# Patient Record
Sex: Male | Born: 1950 | Race: White | Hispanic: No | Marital: Married | Smoking: Never smoker
Health system: Southern US, Community
[De-identification: ages and names within clinical notes are randomized; demographics above are authoritative.]

## PROBLEM LIST (undated history)

## (undated) DIAGNOSIS — M5030 Other cervical disc degeneration, unspecified cervical region: Secondary | ICD-10-CM

## (undated) DIAGNOSIS — K5790 Diverticulosis of intestine, part unspecified, without perforation or abscess without bleeding: Secondary | ICD-10-CM

## (undated) DIAGNOSIS — G4730 Sleep apnea, unspecified: Secondary | ICD-10-CM

## (undated) DIAGNOSIS — I712 Thoracic aortic aneurysm, without rupture: Secondary | ICD-10-CM

## (undated) DIAGNOSIS — E785 Hyperlipidemia, unspecified: Secondary | ICD-10-CM

## (undated) DIAGNOSIS — Z860100 Personal history of colon polyps, unspecified: Secondary | ICD-10-CM

## (undated) DIAGNOSIS — I7121 Aneurysm of the ascending aorta, without rupture: Secondary | ICD-10-CM

## (undated) DIAGNOSIS — I10 Essential (primary) hypertension: Secondary | ICD-10-CM

## (undated) HISTORY — DX: Personal history of colon polyps, unspecified: Z86.0100

## (undated) HISTORY — DX: Essential (primary) hypertension: I10

## (undated) HISTORY — DX: Diverticulosis of intestine, part unspecified, without perforation or abscess without bleeding: K57.90

## (undated) HISTORY — DX: Hyperlipidemia, unspecified: E78.5

## (undated) HISTORY — DX: Aneurysm of the ascending aorta, without rupture: I71.21

## (undated) HISTORY — PX: OTHER SURGICAL HISTORY: SHX169

## (undated) HISTORY — DX: Sleep apnea, unspecified: G47.30

## (undated) HISTORY — DX: Personal history of colonic polyps: Z86.010

## (undated) HISTORY — PX: COLONOSCOPY: SHX174

## (undated) HISTORY — DX: Thoracic aortic aneurysm, without rupture: I71.2

## (undated) HISTORY — DX: Other cervical disc degeneration, unspecified cervical region: M50.30

## (undated) HISTORY — PX: INGUINAL HERNIA REPAIR: SUR1180

---

## 2003-06-09 ENCOUNTER — Encounter: Admission: RE | Admit: 2003-06-09 | Discharge: 2003-06-09 | Payer: Self-pay | Admitting: Family Medicine

## 2003-11-11 ENCOUNTER — Encounter: Admission: RE | Admit: 2003-11-11 | Discharge: 2003-11-11 | Payer: Self-pay | Admitting: Family Medicine

## 2003-11-24 ENCOUNTER — Encounter: Admission: RE | Admit: 2003-11-24 | Discharge: 2003-11-24 | Payer: Self-pay | Admitting: Family Medicine

## 2004-05-16 ENCOUNTER — Encounter: Admission: RE | Admit: 2004-05-16 | Discharge: 2004-05-16 | Payer: Self-pay | Admitting: Family Medicine

## 2004-09-01 ENCOUNTER — Ambulatory Visit: Payer: Self-pay | Admitting: Cardiology

## 2004-09-30 ENCOUNTER — Ambulatory Visit: Payer: Self-pay | Admitting: Cardiology

## 2004-11-24 ENCOUNTER — Encounter: Admission: RE | Admit: 2004-11-24 | Discharge: 2004-11-24 | Payer: Self-pay | Admitting: Family Medicine

## 2006-07-26 ENCOUNTER — Encounter: Admission: RE | Admit: 2006-07-26 | Discharge: 2006-07-26 | Payer: Self-pay | Admitting: Family Medicine

## 2006-09-20 ENCOUNTER — Ambulatory Visit: Payer: Self-pay | Admitting: Internal Medicine

## 2006-10-03 ENCOUNTER — Encounter: Payer: Self-pay | Admitting: Internal Medicine

## 2006-10-03 ENCOUNTER — Ambulatory Visit: Payer: Self-pay | Admitting: Internal Medicine

## 2008-09-28 ENCOUNTER — Encounter: Admission: RE | Admit: 2008-09-28 | Discharge: 2008-09-28 | Payer: Self-pay | Admitting: Family Medicine

## 2008-10-27 ENCOUNTER — Ambulatory Visit: Payer: Self-pay | Admitting: Thoracic Surgery (Cardiothoracic Vascular Surgery)

## 2008-11-18 ENCOUNTER — Ambulatory Visit (HOSPITAL_COMMUNITY)
Admission: RE | Admit: 2008-11-18 | Discharge: 2008-11-18 | Payer: Self-pay | Admitting: Thoracic Surgery (Cardiothoracic Vascular Surgery)

## 2008-11-18 ENCOUNTER — Encounter: Payer: Self-pay | Admitting: Thoracic Surgery (Cardiothoracic Vascular Surgery)

## 2008-11-18 ENCOUNTER — Ambulatory Visit: Payer: Self-pay | Admitting: Cardiology

## 2009-10-13 ENCOUNTER — Ambulatory Visit: Payer: Self-pay | Admitting: Thoracic Surgery (Cardiothoracic Vascular Surgery)

## 2009-10-13 ENCOUNTER — Encounter
Admission: RE | Admit: 2009-10-13 | Discharge: 2009-10-13 | Payer: Self-pay | Admitting: Thoracic Surgery (Cardiothoracic Vascular Surgery)

## 2010-09-07 ENCOUNTER — Other Ambulatory Visit: Payer: Self-pay | Admitting: Thoracic Surgery (Cardiothoracic Vascular Surgery)

## 2010-09-07 DIAGNOSIS — I712 Thoracic aortic aneurysm, without rupture: Secondary | ICD-10-CM

## 2010-09-20 NOTE — Assessment & Plan Note (Signed)
OFFICE VISIT   BARNETT, ELZEY  DOB:  [birth year]                                        October 13, 2009  CHART #:  [id number]   REASON FOR VISIT:  Followup ascending aortic aneurysm.   HISTORY OF PRESENT ILLNESS:  The patient is a 60 year old gentleman with  a known ascending thoracic aneurysm.  This was first diagnosed in 2005.  He had a followup scan a year later which really had not changed and  then he missed a couple of years.  He had a followup scan in 2010 and  had increased slightly in size from about 4.2 to 4.3 cm to 4.5 cm.  He  has increased on the order of 2 mm in diameter.  He now returns for  annual followup scan.  He says he has been doing well.  He has not been  exercising particularly on a regular basis.  He did not have any chest  pain, shortness of breath, peripheral edema, orthopnea, or paroxysmal  nocturnal dyspnea.  No dizziness, presyncope, or syncope.  All other  systems are negative.   CURRENT MEDICATIONS:  1. Lovastatin 40 mg daily.  2. Levitra 20 mg daily.   ALLERGIES:  He has an allergy to Septra.   FAMILY HISTORY:  Significant for cardiovascular disease.  Both parents  have abdominal aortic aneurysms.   SOCIAL HISTORY:  He is a nonsmoker.   REVIEW OF SYSTEMS:  See HPI, otherwise all systems are negative.   PHYSICAL EXAMINATION:  General:  The patient is a 60 year old gentleman  in no acute distress.  He is alert and oriented x3 with no focal  deficits.  Vital Signs:  Blood pressure is 136/86, pulse 86,  respirations 16, ox saturation is 93% on room air.  HEENT:  Unremarkable.  He has no carotid bruits.  Lungs:  Clear with equal  breath sounds bilaterally.  Cardiac:  Regular rate and rhythm.  Normal  S1 and S2.  No rubs, murmurs, or gallops.  Abdomen:  Soft, nontender.  No pulsatile masses.  He has 2+ pulses throughout.   LABORATORY DATA:  CT angiogram was reviewed.  It shows no change in the  size of his aortic aneurysm.   Maximal diameter is about 4.4 cm.  The  cuts go down to just about the top of the kidneys.  There is no evidence  of an abdominal aneurysm either.   IMPRESSION:  The patient is a 60 year old gentleman with an ascending  aortic aneurysm.  This has really changed very little over 5- to 6-year  period of time, stable at 4.5 cm.  It large enough that needs to  continue to be followed on an annual basis.  I did recommend to him that  we try doing MR angio the next time to avoid cumulative radiation dosing  from repeated CT scans as this thing may end up being followed for 10 or  15 years and over time he could get quite a significant radiation dosing  with CT scans, so we will do MR angio on the next visit.  We will also  include the abdomen given his strong family history with both parents  having had abdominal aortic aneurysms.  He does not have any physical  signs or symptoms of that at the present time, but it would not  be a bad  idea to have a baseline through that area, but we will do it at the time  of his next chest CT.  He did have an echocardiogram after his last  visit that showed normal left ventricular function with no valvular  pathology.  Again, I will plan to see the patient back in 1 year.   Salvatore Decent Dorris Fetch, M.D.  Electronically Signed   SCH/MEDQ  D:  10/13/2009  T:  10/13/2009  Job:  [id number]   cc:   Mosetta Putt, M.D.

## 2010-09-20 NOTE — Consult Note (Signed)
NEW PATIENT CONSULTATION   Troy, Reed  DOB:  04/03/1951                                        October 27, 2008  CHART #:  [id number]   The patient is a 60 year old gentleman, who presents with chief  complaint of an aortic aneurysm.   HISTORY OF PRESENT ILLNESS:  The patient is a 60 year old gentleman with  a known history of an ascending thoracic aneurysm.  This was first  diagnosed in 2005, was detected on CT scan, it was 4.2 x 4.3 cm at that  time.  He subsequently had a followup scan in 2006.  It showed no  significant change.  In 2008 the measurements were actually slightly  smaller with a diameter recorded at 42 x 41 mm.  He recently was seen by  Dr. Duaine Dredge who scheduled him for another CT which was done on Sep 28, 2008, that was read as slight enlargement with a diameter of 4.4 x 4.5  cm in diameter.   The patient has been feeling well.  He has not had any chest pain.  He  does have some shortness of breath only with heavy exertion.  In general  feels well and is without complaint.   His past medical history is significant for hypercholesterolemia.  He  specifically denies any previous heart disease including heart attack,  valvular disease.  No history of stroke, diabetes, or hypertension.   His current medications are lovastatin 40 mg daily and Levitra 20 mg  daily p.r.n.   He has an allergy to Septra which causes a rash.   His family history is significant for cardiovascular disease,  specifically his father had abdominal aortic aneurysm.  His brother has  had heart attack.   SOCIAL HISTORY:  He is married with 3 children.  He works as a  Research scientist (medical).  He does not smoke or drink.   REVIEW OF SYSTEMS:  The patient's medical history form is reviewed and  is on the chart other than weight gain and shortness of breath with  exertion.  All systems are negative.   PHYSICAL EXAMINATION:  GENERAL:  The patient is an overweight, but  otherwise  well-appearing 60 year old white male in no acute distress.  He is well developed and well nourished.  VITAL SIGNS:  His blood pressure is 152/89, pulse 68, respirations are  18, and his oxygen saturation is 96% on room air.  NEUROLOGIC:  He is alert and oriented x3 with no focal deficits.  HEENT:  Unremarkable.  NECK:  Without thyromegaly, adenopathy, or bruits.  CARDIAC:  Regular rate and rhythm.  Normal S1 and S2.  There is a 2/6  systolic ejection murmur at the right upper sternal border.  He has 2+  pulses throughout.  There is no palpable aneurysms.  LUNGS:  Clear with equal breath sounds bilaterally.  ABDOMEN:  Soft and nontender.   LABORATORY DATA:  CT scans from Sep 28, 2008, was reviewed and compared  to scans from July 26, 2006; November 24, 2004; and November 24, 2003.  All  scans show an enlarged ascending aorta.  Measurements recorded by the  radiologist, most recent were 45 x 44 mm.  Viewing coronal sections, it  appears the largest diameter with a true cross-section, is approximately  41 mm and on sagittal appears to be approximately 41  mm as well.   IMPRESSION:  The patient is a 60 year old gentleman with a history of an  ascending aortic aneurysm.  He does not have a history of hypertension.  He was hypertensive on this visit, but I would not institute medications  based on this one reading as he has been followed closely by Dr.  Duaine Dredge with no previous history.  That should have attention noted on  his next visit.  He does have approximately 4.5 cm ascending aorta with  the true diameter quite closer to 41 mm and 45 mm.  This has increased  in size by a couple of millimeters over the past 5 years.  There has not  been a dramatic increase in size and there is no indication for surgery  at this point in time, but this does warrant continued followup.  I did  recommend to the patient at this point given that this has likely  increased, then we should re-scan him in 1 year to  make sure that there  is not some acceleration of the process.  In the meantime, I do think we  also should get an echocardiogram as ascending aortic aneurysm are  associated with bicuspid aortic valve, so he does have a faint murmur.  I doubt that he has significant aortic stenosis at this point, but if he  has a bicuspid valve  that would need to be followed as well.  As noted I will plan to see the  patient back in 1 year with a repeat CT angio.  In the meantime, we will  await the report of the echocardiogram.   Troy Reed. Dorris Fetch, M.D.  Electronically Signed   SCH/MEDQ  D:  10/27/2008  T:  10/28/2008  Job:  [id number]   cc:   Mosetta Putt, M.D.

## 2010-09-20 NOTE — Letter (Signed)
October 27, 2008   Mosetta Putt, MD  [address] [place], [place] [address]   Re:  [name], Troy Reed             DOB:  [birth year]   Dear Theron Arista,   Thank you very much for sending the patient for evaluation of his  ascending aortic aneurysm.  In reviewing his films dating back to 2005,  it does appear that this aneurysm has increased slightly in size.  It is  still only maximal diameter of 4.4-4.5 cm and there is no indication for  surgery.  I discussed the issue and reviewed the films with the patient  and we have planned to follow him up in 1 year with a repeat CT angio.  In the meantime, ascending aortic aneurysms do have some association  with bicuspid aortic valve.  We will schedule the patient for an  echocardiogram to assess his aortic valve.  If it is bicuspid that may  require separate followup with echocardiography as well.  Again, thank  you for sending the patient to the office and I will keep you informed.  In general you would be looking at a diameter of 5.5-6 cm before you  consider elective surgery for an ascending aortic aneurysm unless there  is a pattern of increasing growth usually greater than 5 mm in a year.  Thank you very much.   Sincerely,   Salvatore Decent. Dorris Fetch, M.D.  Electronically Signed   SCH/MEDQ  D:  10/27/2008  T:  10/28/2008  Job:  [id number]

## 2010-10-20 ENCOUNTER — Encounter: Payer: Self-pay | Admitting: Thoracic Surgery (Cardiothoracic Vascular Surgery)

## 2010-10-20 ENCOUNTER — Other Ambulatory Visit: Payer: Self-pay | Admitting: Thoracic Surgery (Cardiothoracic Vascular Surgery)

## 2010-10-20 DIAGNOSIS — I714 Abdominal aortic aneurysm, without rupture: Secondary | ICD-10-CM

## 2010-11-02 ENCOUNTER — Other Ambulatory Visit: Payer: Self-pay

## 2010-11-02 ENCOUNTER — Ambulatory Visit
Admission: RE | Admit: 2010-11-02 | Discharge: 2010-11-02 | Disposition: A | Discharge status: Home / Self Care | Payer: BC Managed Care – PPO | Source: Ambulatory Visit | Attending: Thoracic Surgery (Cardiothoracic Vascular Surgery) | Admitting: Thoracic Surgery (Cardiothoracic Vascular Surgery)

## 2010-11-02 DIAGNOSIS — I714 Abdominal aortic aneurysm, without rupture: Secondary | ICD-10-CM

## 2010-11-02 DIAGNOSIS — I712 Thoracic aortic aneurysm, without rupture: Secondary | ICD-10-CM

## 2010-11-02 MED ORDER — GADOBENATE DIMEGLUMINE 529 MG/ML IV SOLN
20.0000 mL | Freq: Once | INTRAVENOUS | Status: AC | PRN
  Administered 2010-11-02: 20 mL via INTRAVENOUS

## 2010-11-03 ENCOUNTER — Encounter (INDEPENDENT_AMBULATORY_CARE_PROVIDER_SITE_OTHER): Payer: BC Managed Care – PPO | Admitting: Thoracic Surgery (Cardiothoracic Vascular Surgery)

## 2010-11-03 ENCOUNTER — Encounter: Payer: Self-pay | Admitting: Thoracic Surgery (Cardiothoracic Vascular Surgery)

## 2010-11-03 DIAGNOSIS — I712 Thoracic aortic aneurysm, without rupture: Secondary | ICD-10-CM

## 2010-11-04 NOTE — Assessment & Plan Note (Signed)
OFFICE VISIT  Troy Reed, Troy Reed DOB:  February 05, 1951                                        November 03, 2010 CHART #:  [id number]  HISTORY OF PRESENT ILLNESS:  The patient is a 60 year old gentleman who has been following for the past 2 years with an ascending aortic aneurysm.  This was first diagnosed in 2005, a year later this had not changed.  He then was  lost to follow up for a few years and was seen back in 2010, the diameter has increased slightly from 4.2 was called 4.5 cm at that time.  He was seen back in 2011, at which time the scan measured approximately 4.5 cm again.  He now returns for an annual follow up with an MRI of the chest.  He states that since I last saw him a year ago, he is retired.  He has been little more active in terms of exercise.  He has lost a little weight.  He has not had any chest pain, shortness of breath, PND, orthopnea, or peripheral edema.  PAST MEDICAL HISTORY:  Significant for hypertension, ascending aortic aneurysm, and hyperlipidemia.  CURRENT MEDICATIONS: 1. Lovastatin 40 mg daily. 2. Levitra 20 mg p.r.n.  ALLERGIES:  Septra and penicillin, which is a new allergy.  FAMILY HISTORY:  Significant for abdominal aortic aneurysms.  SOCIAL HISTORY:  He is a nonsmoker, retired, remains physically active.  REVIEW OF SYSTEMS:  See HPI.  Denies dizziness, presyncope or syncope. All other systems are negative.  PHYSICAL EXAMINATION:  GENERAL:  The patient is a 60 year old gentleman in no acute distress. VITAL SIGNS:  Blood pressure 129/77, pulse 64, respirations of 18, and oxygen saturation 96% on room air. NECK:  No carotid bruits. CARDIAC:  Regular rate and rhythm.  Normal S1 and S2.  No murmurs, rubs or gallops. LUNGS:  Clear with equal breath sounds bilaterally.  There is no palpable aneurysms.  There is  2+ peripheral pulses throughout.  MR angio of the chest showed a 4.3 cm maximal diameter fusiform ascending aortic  aneurysm.  IMPRESSION:  The patient is a 60 year old gentleman who is now been followed for about 7 years in total with no evidence of increase in size of his aneurysm.  He does need to have continued annual followups for that and certainly he knows to call immediately for medical attention if he has any severe sudden onset chest pain.  His blood pressure is within normal limits, if he remains physically active.  Salvatore Decent Dorris Fetch, M.D. Electronically Signed  SCH/MEDQ  D:  11/03/2010  T:  11/04/2010  Job:  [id number]  cc:   Mosetta Putt, M.D.

## 2011-08-02 ENCOUNTER — Encounter: Payer: Self-pay | Admitting: Internal Medicine

## 2011-08-21 ENCOUNTER — Encounter: Payer: Self-pay | Admitting: Internal Medicine

## 2011-09-15 ENCOUNTER — Other Ambulatory Visit: Payer: Self-pay | Admitting: Thoracic Surgery (Cardiothoracic Vascular Surgery)

## 2011-09-15 DIAGNOSIS — I712 Thoracic aortic aneurysm, without rupture: Secondary | ICD-10-CM

## 2011-09-25 ENCOUNTER — Encounter: Payer: Self-pay | Admitting: Internal Medicine

## 2011-09-25 ENCOUNTER — Ambulatory Visit (AMBULATORY_SURGERY_CENTER): Payer: BC Managed Care – PPO | Admitting: *Deleted

## 2011-09-25 VITALS — Ht 73.5 in | Wt 251.0 lb

## 2011-09-25 DIAGNOSIS — Z1211 Encounter for screening for malignant neoplasm of colon: Secondary | ICD-10-CM

## 2011-09-25 MED ORDER — PEG-KCL-NACL-NASULF-NA ASC-C 100 G PO SOLR: ORAL | Status: DC

## 2011-10-09 ENCOUNTER — Encounter: Payer: Self-pay | Admitting: Internal Medicine

## 2011-10-09 ENCOUNTER — Ambulatory Visit (AMBULATORY_SURGERY_CENTER): Payer: BC Managed Care – PPO | Admitting: Internal Medicine

## 2011-10-09 VITALS — BP 140/88 | HR 51 | Temp 96.0°F | Resp 31 | Ht 73.0 in | Wt 251.0 lb

## 2011-10-09 DIAGNOSIS — Z1211 Encounter for screening for malignant neoplasm of colon: Secondary | ICD-10-CM

## 2011-10-09 DIAGNOSIS — Z860100 Personal history of colon polyps, unspecified: Secondary | ICD-10-CM | POA: Insufficient documentation

## 2011-10-09 DIAGNOSIS — K5730 Diverticulosis of large intestine without perforation or abscess without bleeding: Secondary | ICD-10-CM

## 2011-10-09 MED ORDER — SODIUM CHLORIDE 0.9 % IV SOLN: 500.0000 mL | INTRAVENOUS | Status: DC

## 2011-10-09 NOTE — Progress Notes (Signed)
Patient did not have preoperative order for IV antibiotic SSI prophylaxis. (G8918)  Patient did not experience any of the following events: a burn prior to discharge; a fall within the facility; wrong site/side/patient/procedure/implant event; or a hospital transfer or hospital admission upon discharge from the facility. (G8907)  

## 2011-10-09 NOTE — Patient Instructions (Signed)
YOU HAD AN ENDOSCOPIC PROCEDURE TODAY AT THE [facility]: Refer to the procedure report that was given to you for any specific questions about what was found during the examination.  If the procedure report does not answer your questions, please call your gastroenterologist to clarify.  If you requested that your care partner not be given the details of your procedure findings, then the procedure report has been included in a sealed envelope for you to review at your convenience later.  YOU SHOULD EXPECT: Some feelings of bloating in the abdomen. Passage of more gas than usual.  Walking can help get rid of the air that was put into your GI tract during the procedure and reduce the bloating. If you had a lower endoscopy (such as a colonoscopy or flexible sigmoidoscopy) you may notice spotting of blood in your stool or on the toilet paper. If you underwent a bowel prep for your procedure, then you may not have a normal bowel movement for a few days.  DIET: Your first meal following the procedure should be a light meal and then it is ok to progress to your normal diet.  A half-sandwich or bowl of soup is an example of a good first meal.  Heavy or fried foods are harder to digest and may make you feel nauseous or bloated.  Likewise meals heavy in dairy and vegetables can cause extra gas to form and this can also increase the bloating.  Drink plenty of fluids but you should avoid alcoholic beverages for 24 hours.  ACTIVITY: Your care partner should take you home directly after the procedure.  You should plan to take it easy, moving slowly for the rest of the day.  You can resume normal activity the day after the procedure however you should NOT DRIVE or use heavy machinery for 24 hours (because of the sedation medicines used during the test).    SYMPTOMS TO REPORT IMMEDIATELY: A gastroenterologist can be reached at any hour.  During normal business hours, 8:30 AM to 5:00 PM Monday through Friday,  call [phone].  After hours and on weekends, please call the GI answering service at [phone] who will take a message and have the physician on call contact you.   Following lower endoscopy (colonoscopy or flexible sigmoidoscopy):  Excessive amounts of blood in the stool  Significant tenderness or worsening of abdominal pains  Swelling of the abdomen that is new, acute  Fever of 100F or higher    FOLLOW UP: If any biopsies were taken you will be contacted by phone or by letter within the next 1-3 weeks.  Call your gastroenterologist if you have not heard about the biopsies in 3 weeks.  Our staff will call the home number listed on your records the next business day following your procedure to check on you and address any questions or concerns that you may have at that time regarding the information given to you following your procedure. This is a courtesy call and so if there is no answer at the home number and we have not heard from you through the emergency physician on call, we will assume that you have returned to your regular daily activities without incident.  SIGNATURES/CONFIDENTIALITY: You and/or your care partner have signed paperwork which will be entered into your electronic medical record.  These signatures attest to the fact that that the information above on your After Visit Summary has been reviewed and is understood.  Full responsibility of the confidentiality   of this discharge information lies with you and/or your care-partner.     

## 2011-10-09 NOTE — Progress Notes (Signed)
The pt tolerated the colonoscopy very well. Maw   

## 2011-10-09 NOTE — Op Note (Signed)
[facility] [address] [place], [place]  [address]  COLONOSCOPY PROCEDURE REPORT  PATIENT:  Troy Reed, Troy Reed  MR#:  [id number] BIRTHDATE:  Sep 06, 1950, 61 yrs. old  GENDER:  male ENDOSCOPIST:  Iva Boop, MD, [facility]  PROCEDURE DATE:  10/09/2011 PROCEDURE:  Colonoscopy 19147 ASA CLASS:  Class II INDICATIONS:  surveillance and high-risk screening, history of pre-cancerous (adenomatous) colon polyps 25 mm villous adenoma 2005 index 4 mm adenoma 2008 MEDICATIONS:   These medications were titrated to patient response per physician's verbal order, MAC sedation, administered by CRNA, propofol (Diprivan) 200 mg IV  DESCRIPTION OF PROCEDURE:   After the risks benefits and alternatives of the procedure were thoroughly explained, informed consent was obtained.  Digital rectal exam was performed and revealed no rectal masses and an enlarged prostate.  Slightly enlarged prostate, no nodules. The LB CF-[id number] endoscope was introduced through the anus and advanced to the cecum, which was identified by both the appendix and ileocecal valve, without limitations.  The quality of the prep was excellent, using MoviPrep.  The instrument was then slowly withdrawn as the colon was fully examined. <<PROCEDUREIMAGES>>  FINDINGS:  Moderate diverticulosis was found throughout the colon. This was otherwise a normal examination of the colon. Includes right colon retroflexion.   Retroflexed views in the rectum revealed internal hemorrhoids.    The time to cecum = 2:14 minutes. The scope was then withdrawn in 10:08 minutes from the cecum and the procedure completed. COMPLICATIONS:  None ENDOSCOPIC IMPRESSION: 1) Moderate diverticulosis throughout the colon 2) Internal hemorrhoids 3) Otherwise normal examination, excellent prep 4) Personal history of adenomas 2005 and 2008  REPEAT EXAM:  In 5 year(s) for routine screening colonoscopy.  Iva Boop, MD, [facility]  CC:  Mosetta Putt,  MD and The Patient  n. eSIGNED:   Iva Boop at 10/09/2011 09:39 AM  Dan Humphreys, [phone]

## 2011-10-10 ENCOUNTER — Telehealth: Payer: Self-pay | Admitting: *Deleted

## 2011-10-10 NOTE — Telephone Encounter (Signed)
  Follow up Call-  Call back number 10/09/2011  Post procedure Call Back phone  # [phone]  Permission to leave phone message Yes     Patient questions:  Do you have a fever, pain , or abdominal swelling? no Pain Score  0 *  Have you tolerated food without any problems? yes  Have you been able to return to your normal activities? yes  Do you have any questions about your discharge instructions: Diet   no Medications  no Follow up visit  no  Do you have questions or concerns about your Care? no  Actions: * If pain score is 4 or above: No action needed, pain <4.

## 2011-10-19 ENCOUNTER — Ambulatory Visit
Admission: RE | Admit: 2011-10-19 | Discharge: 2011-10-19 | Disposition: A | Discharge status: Home / Self Care | Payer: BC Managed Care – PPO | Source: Ambulatory Visit | Attending: Thoracic Surgery (Cardiothoracic Vascular Surgery) | Admitting: Thoracic Surgery (Cardiothoracic Vascular Surgery)

## 2011-10-19 DIAGNOSIS — I712 Thoracic aortic aneurysm, without rupture: Secondary | ICD-10-CM

## 2011-10-19 MED ORDER — GADOBENATE DIMEGLUMINE 529 MG/ML IV SOLN
20.0000 mL | Freq: Once | INTRAVENOUS | Status: AC | PRN
  Administered 2011-10-19: 20 mL via INTRAVENOUS

## 2011-10-23 ENCOUNTER — Other Ambulatory Visit: Payer: BC Managed Care – PPO

## 2011-10-24 ENCOUNTER — Encounter: Payer: Self-pay | Admitting: Thoracic Surgery (Cardiothoracic Vascular Surgery)

## 2011-10-24 ENCOUNTER — Ambulatory Visit (INDEPENDENT_AMBULATORY_CARE_PROVIDER_SITE_OTHER): Payer: BC Managed Care – PPO | Admitting: Thoracic Surgery (Cardiothoracic Vascular Surgery)

## 2011-10-24 VITALS — BP 126/79 | HR 60 | Resp 18 | Ht 74.0 in | Wt 248.0 lb

## 2011-10-24 DIAGNOSIS — I712 Thoracic aortic aneurysm, without rupture: Secondary | ICD-10-CM

## 2011-10-24 NOTE — Progress Notes (Signed)
  HPI:  Troy Reed returns today for a scheduled followup visit ascending aortic aneurysm.  He has no complaints. He says his been doing well over the past year. He denies any chest pain or shortness of breath. He did have some questions related to statin medications and possible side effects. He denies any myalgias. His main concern is that he heard there can be issues with memory loss.  Past Medical History  Diagnosis Date  . Hyperlipidemia   . Ascending aortic aneurysm       Current Outpatient Prescriptions  Medication Sig Dispense Refill  . lovastatin (MEVACOR) 40 MG tablet Take 40 mg by mouth at bedtime.         Physical Exam BP 126/79  Pulse 60  Resp 18  Ht 6\' 2"  (1.88 m)  Wt 248 lb (112.492 kg)  BMI 31.84 kg/m2  SpO2 96% General well-developed well-nourished 61 year old male in no acute distress Neurologic alert and oriented x3 Lungs clear with equal breath sounds bilaterally Cardiac regular rate and rhythm no rubs or murmurs Pulses intact  Diagnostic Tests:  MR angiogram chest 10/19/2011 IMPRESSION:  Unchanged fusiform ectasia of the ascending thoracic aorta  measuring approximately 4.5 cm in greatest transverse axial  dimension.  Impression: 61 year old gentleman with a 4.5 cm descending aortic aneurysm. This has been stable. There is no indication for surgery at the present time. I encouraged him to stay on his statin medication for hyper cholesterolemia unless he experiences side effects in which case he should discuss with Dr. Duaine Dredge. His blood pressure is normal.  Plan: Return in one year with MRA and of the chest for followup the ascending aortic aneurysm.

## 2012-06-26 ENCOUNTER — Other Ambulatory Visit: Payer: Self-pay | Admitting: Family Medicine

## 2012-06-26 ENCOUNTER — Ambulatory Visit
Admission: RE | Admit: 2012-06-26 | Discharge: 2012-06-26 | Disposition: A | Discharge status: Home / Self Care | Payer: BC Managed Care – PPO | Source: Ambulatory Visit | Attending: Family Medicine | Admitting: Family Medicine

## 2012-06-26 DIAGNOSIS — R52 Pain, unspecified: Secondary | ICD-10-CM

## 2012-09-27 ENCOUNTER — Other Ambulatory Visit: Payer: Self-pay | Admitting: *Deleted

## 2012-09-27 DIAGNOSIS — I712 Thoracic aortic aneurysm, without rupture: Secondary | ICD-10-CM

## 2012-10-21 ENCOUNTER — Ambulatory Visit
Admission: RE | Admit: 2012-10-21 | Discharge: 2012-10-21 | Disposition: A | Discharge status: Home / Self Care | Payer: BC Managed Care – PPO | Source: Ambulatory Visit | Attending: Thoracic Surgery (Cardiothoracic Vascular Surgery) | Admitting: Thoracic Surgery (Cardiothoracic Vascular Surgery)

## 2012-10-21 DIAGNOSIS — I712 Thoracic aortic aneurysm, without rupture: Secondary | ICD-10-CM

## 2012-10-21 MED ORDER — GADOBENATE DIMEGLUMINE 529 MG/ML IV SOLN
20.0000 mL | Freq: Once | INTRAVENOUS | Status: AC | PRN
  Administered 2012-10-21: 20 mL via INTRAVENOUS

## 2012-10-22 ENCOUNTER — Ambulatory Visit (INDEPENDENT_AMBULATORY_CARE_PROVIDER_SITE_OTHER): Payer: BC Managed Care – PPO | Admitting: Thoracic Surgery (Cardiothoracic Vascular Surgery)

## 2012-10-22 ENCOUNTER — Encounter: Payer: Self-pay | Admitting: Thoracic Surgery (Cardiothoracic Vascular Surgery)

## 2012-10-22 VITALS — BP 146/88 | HR 61 | Resp 20 | Ht 74.0 in | Wt 250.0 lb

## 2012-10-22 DIAGNOSIS — I712 Thoracic aortic aneurysm, without rupture: Secondary | ICD-10-CM

## 2012-10-22 DIAGNOSIS — E785 Hyperlipidemia, unspecified: Secondary | ICD-10-CM

## 2012-10-22 DIAGNOSIS — I10 Essential (primary) hypertension: Secondary | ICD-10-CM | POA: Insufficient documentation

## 2012-10-22 NOTE — Progress Notes (Signed)
HPI: 62 year old gentleman with a known ascending aortic aneurysm. This was first discovered back in 2005. He was last seen a year ago which time the aneurysm measured 4.5 cm in diameter. It was originally around 4.2-4.3 cm in diameter.  In the interim since his last visit he says his been feeling well. He's not had any chest pain or shortness of breath. About a week ago Dr. Duaine Dredge put him on Maxzide for his blood pressure. He had not previously had hypertension.  Past Medical History  Diagnosis Date  . Hyperlipidemia   . Ascending aortic aneurysm    hypertension   Current Outpatient Prescriptions  Medication Sig Dispense Refill  . lovastatin (MEVACOR) 40 MG tablet Take 40 mg by mouth at bedtime.       . triamterene-hydrochlorothiazide (MAXZIDE-25) 37.5-25 MG per tablet Take 1 tablet by mouth daily.        No current facility-administered medications for this visit.    Physical Exam BP 146/88  Pulse 61  Resp 20  Ht 6\' 2"  (1.88 m)  Wt 250 lb (113.399 kg)  BMI 32.08 kg/m2  SpO23 87% 62 year old male in no acute distress Well-developed well-nourished Neurologically alert and oriented x3 with no focal deficits Cardiac regular rate and rhythm, normal S1 and S2 no rubs murmurs or gallops No carotid bruits Peripheral pulses intact Lungs clear bilaterally  Diagnostic Tests: MR angiogram 10/21/2012 MRA CHEST WITH OR WITHOUT CONTRAST  Technique: Angiographic images of the chest were obtained using MRA  technique without and with intravenous contrast.  Contrast: 20mL MULTIHANCE GADOBENATE DIMEGLUMINE 529 MG/ML IV SOLN  BUN, creatinine and GFR were obtained on site at [facility]  at [address]  Results: BUN 10 mg/dL, Creatinine 1.1 mg/dL, GFR 67  Comparison: Prior MRI of the chest 10/19/2011  Findings: Conventional three-vessel arch anatomy. Stable ascending  thoracic aortic aneurysm with a maximal transverse diameter of 4.5  cm in the ascending segment  (measured at the level of the main  pulmonary artery - image 54 of series 18). The aortic root measures  3.9 cm at the sinuses of Valsalva and 3.6 cm at the sinotubular  junction. The transverse thoracic aorta measures 2.8 cm in  diameter between the origins of the left common carotid and left  subclavian artery. The descending thoracic aorta is within normal  limits at 2.7 cm in caliber.  No evidence of dissection flap, mural thrombus or irregularity.  No suspicious pulmonary nodules or abnormalities. No pleural  effusion. No enhancing soft tissue lesion identified. Scattered  unremarkable sized axillary lymph nodes. No focal osseous signal  abnormality.  IMPRESSION:  Stable fusiform ectatic/aneurysmal ascending thoracic aorta with a  maximal transverse dimension of 4.5 cm.  Signed,  Sterling Big, MD  Vascular & Interventional Radiologist  [facility]  Impression: [name] is a 62 year old gentleman with a 4.5 cm ascending aortic aneurysm. This is unchanged over the past year. It has grown minimally over the 9 year period since it was first discovered. The only change in the interim since his last visit is that he has been diagnosed with hypertension. He was started on Maxzide about a week ago. His blood pressure remains elevated today. I do not want to start any additional medications at this time. He has a followup appointment with Dr. Duaine Dredge in 3 weeks to assess his blood pressure again.   There is no indication for surgery at this time  Plan: Followup with Dr. Duaine Dredge regarding blood pressure.  Return in one year with MRA angiogram to followup the ascending aortic aneurysm.

## 2012-12-05 ENCOUNTER — Other Ambulatory Visit: Payer: Self-pay | Admitting: Family Medicine

## 2012-12-05 ENCOUNTER — Ambulatory Visit
Admission: RE | Admit: 2012-12-05 | Discharge: 2012-12-05 | Disposition: A | Discharge status: Home / Self Care | Payer: BC Managed Care – PPO | Source: Ambulatory Visit | Attending: Family Medicine | Admitting: Family Medicine

## 2012-12-05 DIAGNOSIS — T148XXA Other injury of unspecified body region, initial encounter: Secondary | ICD-10-CM

## 2012-12-05 DIAGNOSIS — T1490XA Injury, unspecified, initial encounter: Secondary | ICD-10-CM

## 2012-12-05 MED ORDER — IOHEXOL 300 MG/ML  SOLN
125.0000 mL | Freq: Once | INTRAMUSCULAR | Status: AC | PRN
  Administered 2012-12-05: 125 mL via INTRAVENOUS

## 2013-05-20 LAB — PULMONARY FUNCTION TEST

## 2013-05-30 ENCOUNTER — Other Ambulatory Visit (HOSPITAL_COMMUNITY): Payer: Self-pay | Admitting: Family Medicine

## 2013-05-30 DIAGNOSIS — R0602 Shortness of breath: Secondary | ICD-10-CM

## 2013-06-10 ENCOUNTER — Ambulatory Visit (HOSPITAL_COMMUNITY)
Admission: RE | Admit: 2013-06-10 | Discharge: 2013-06-10 | Disposition: A | Discharge status: Home / Self Care | Payer: Medicare Other | Source: Ambulatory Visit | Attending: Family Medicine | Admitting: Family Medicine

## 2013-06-10 DIAGNOSIS — R0602 Shortness of breath: Secondary | ICD-10-CM | POA: Insufficient documentation

## 2013-06-10 DIAGNOSIS — R062 Wheezing: Secondary | ICD-10-CM | POA: Insufficient documentation

## 2013-06-10 LAB — PULMONARY FUNCTION TEST
DL/VA % pred: 116 %
DL/VA: 5.49 ml/min/mmHg/L
DLCO UNC % PRED: 101 %
DLCO unc: 35.54 ml/min/mmHg
FEF 25-75 PRE: 2.55 L/s
FEF 25-75 Post: 3.31 L/sec
FEF2575-%Change-Post: 29 %
FEF2575-%Pred-Post: 113 %
FEF2575-%Pred-Pre: 87 %
FEV1-%CHANGE-POST: 8 %
FEV1-%Pred-Post: 89 %
FEV1-%Pred-Pre: 82 %
FEV1-PRE: 3.03 L
FEV1-Post: 3.29 L
FEV1FVC-%Change-Post: -5 %
FEV1FVC-%PRED-PRE: 101 %
FEV6-%Change-Post: 10 %
FEV6-%PRED-POST: 93 %
FEV6-%PRED-PRE: 84 %
FEV6-POST: 4.38 L
FEV6-PRE: 3.95 L
FEV6FVC-%CHANGE-POST: -1 %
FEV6FVC-%PRED-POST: 103 %
FEV6FVC-%PRED-PRE: 104 %
FVC-%Change-Post: 14 %
FVC-%PRED-PRE: 80 %
FVC-%Pred-Post: 91 %
FVC-POST: 4.54 L
FVC-PRE: 3.98 L
POST FEV6/FVC RATIO: 98 %
PRE FEV6/FVC RATIO: 99 %
Post FEV1/FVC ratio: 72 %
Pre FEV1/FVC ratio: 76 %
RV % PRED: 143 %
RV: 3.52 L
TLC % PRED: 103 %
TLC: 7.67 L

## 2013-06-10 MED ORDER — ALBUTEROL SULFATE (2.5 MG/3ML) 0.083% IN NEBU
2.5000 mg | INHALATION_SOLUTION | Freq: Once | RESPIRATORY_TRACT | Status: AC
  Administered 2013-06-10: 2.5 mg via RESPIRATORY_TRACT

## 2013-09-17 ENCOUNTER — Other Ambulatory Visit: Payer: Self-pay

## 2013-09-17 DIAGNOSIS — I712 Thoracic aortic aneurysm, without rupture, unspecified: Secondary | ICD-10-CM

## 2013-09-23 ENCOUNTER — Other Ambulatory Visit: Payer: Self-pay

## 2013-09-23 DIAGNOSIS — I712 Thoracic aortic aneurysm, without rupture, unspecified: Secondary | ICD-10-CM

## 2013-10-14 ENCOUNTER — Ambulatory Visit
Admission: RE | Admit: 2013-10-14 | Discharge: 2013-10-14 | Disposition: A | Discharge status: Home / Self Care | Payer: Medicare Other | Source: Ambulatory Visit | Attending: Thoracic Surgery (Cardiothoracic Vascular Surgery) | Admitting: Thoracic Surgery (Cardiothoracic Vascular Surgery)

## 2013-10-14 ENCOUNTER — Encounter: Payer: Self-pay | Admitting: Thoracic Surgery (Cardiothoracic Vascular Surgery)

## 2013-10-14 ENCOUNTER — Ambulatory Visit (INDEPENDENT_AMBULATORY_CARE_PROVIDER_SITE_OTHER): Payer: Medicare Other | Admitting: Thoracic Surgery (Cardiothoracic Vascular Surgery)

## 2013-10-14 VITALS — BP 119/72 | HR 69 | Resp 20 | Ht 74.0 in | Wt 250.0 lb

## 2013-10-14 DIAGNOSIS — I712 Thoracic aortic aneurysm, without rupture, unspecified: Secondary | ICD-10-CM

## 2013-10-14 DIAGNOSIS — I7121 Aneurysm of the ascending aorta, without rupture: Secondary | ICD-10-CM

## 2013-10-14 MED ORDER — GADOBENATE DIMEGLUMINE 529 MG/ML IV SOLN
20.0000 mL | Freq: Once | INTRAVENOUS | Status: AC | PRN
Start: 2013-10-14 — End: 2013-10-14
  Administered 2013-10-14: 20 mL via INTRAVENOUS

## 2013-10-14 NOTE — Progress Notes (Signed)
  HPI:  63 year old gentleman with a known ascending aortic aneurysm. This was first discovered back in 2005. He was last seen in the office in June of 2014 at which time the aneurysm measured 4.5 cm in diameter.  It was originally around 4.2-4.3 cm in diameter. Dr. Sandi Mariscal diagnosed him with hypertension for the first time around that time. He has been on medication for that since then.  In the interim since his last visit he says his been feeling well. He denies chest pain, shortness of breath, and swelling in his legs. He remains active.  He is a lifelong nonsmoker.  Past Medical History  Diagnosis Date  . Hyperlipidemia   . Ascending aortic aneurysm    hypertension   Current Outpatient Prescriptions  Medication Sig Dispense Refill  . chlorthalidone (HYGROTON) 25 MG tablet Take 25 mg by mouth daily.       Marland Kitchen losartan (COZAAR) 50 MG tablet Take 50 mg by mouth daily.       Marland Kitchen lovastatin (MEVACOR) 40 MG tablet Take 40 mg by mouth at bedtime.        No current facility-administered medications for this visit.    Physical Exam BP 119/72  Pulse 69  Resp 20  Ht 6\' 2"  (1.88 m)  Wt 250 lb (113.399 kg)  BMI 32.08 kg/m2  SpO19 54% 63 year old man in no acute distress Well-developed and well-nourished Alert and oriented x3 with no focal deficits Cardiac regular rate and rhythm normal S1 and S2 Lungs clear  Diagnostic Tests: MR angiogram has not been officially read at the time of this dictation  Comparison to his MR from June of 2014 shows no significant change  Impression: 63 year old man with a 4.5 cm ascending aortic aneurysm. This has increased minimally in size in the 10 years since it was first discovered. It has not changed significantly over the past year  Plan:  Return in one year with MRA angiogram

## 2014-04-24 ENCOUNTER — Ambulatory Visit
Admission: RE | Admit: 2014-04-24 | Discharge: 2014-04-24 | Disposition: A | Discharge status: Home / Self Care | Payer: Medicare Other | Source: Ambulatory Visit | Attending: Family Medicine | Admitting: Family Medicine

## 2014-04-24 ENCOUNTER — Other Ambulatory Visit: Payer: Self-pay | Admitting: Family Medicine

## 2014-04-24 DIAGNOSIS — M79605 Pain in left leg: Secondary | ICD-10-CM

## 2014-04-24 DIAGNOSIS — M25562 Pain in left knee: Secondary | ICD-10-CM

## 2014-06-02 ENCOUNTER — Telehealth: Payer: Self-pay | Admitting: Cardiology

## 2014-06-02 NOTE — Telephone Encounter (Signed)
Received records from Dr Derinda Late for appointment on 06/10/14 with Dr Percival Spanish.  Records given to [facility] (medical records) for Dr Hochrein's schedule on 06/10/14. lp

## 2014-06-10 ENCOUNTER — Encounter: Payer: Self-pay | Admitting: Cardiology

## 2014-06-10 ENCOUNTER — Ambulatory Visit (INDEPENDENT_AMBULATORY_CARE_PROVIDER_SITE_OTHER): Payer: Medicare Other | Admitting: Cardiology

## 2014-06-10 VITALS — BP 132/70 | HR 67 | Ht 73.0 in | Wt 263.5 lb

## 2014-06-10 DIAGNOSIS — I429 Cardiomyopathy, unspecified: Secondary | ICD-10-CM

## 2014-06-10 DIAGNOSIS — Z8249 Family history of ischemic heart disease and other diseases of the circulatory system: Secondary | ICD-10-CM

## 2014-06-10 DIAGNOSIS — R0600 Dyspnea, unspecified: Secondary | ICD-10-CM

## 2014-06-10 NOTE — Patient Instructions (Signed)
your physician recommends that you schedule a follow-up appointment in: one year with Dr. Percival Spanish   We are ordering a stress test and ct calcium score

## 2014-06-10 NOTE — Progress Notes (Signed)
Cardiology Office Note   Date:  06/10/2014   ID:  Troy Reed, DOB 05/25/1950, MRN [id number]  PCP:  Marylene Land, MD  Cardiologist:   Minus Breeding, MD   Chief Complaint  Patient presents with  . Shortness of Breath      History of Present Illness: Troy Reed is a 64 y.o. male who presents for evaluation of dyspnea. I saw him about a decade ago and he had a negative stress test. He has been having some dyspnea. He thinks this is relatively mild. He can climb a few flights of stairs and will get more short of breath and use to but he thinks it is in keeping with his age. He says he recovers fairly quickly. He denies any PND or orthopnea. He denies any palpitations, presyncope or syncope. He does have mild aortic dilatation that is followed with MRI. He does not notice any weight gain or edema. 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  Past Medical History  Diagnosis Date  . Hyperlipidemia   . Ascending aortic aneurysm     4.4 cm  . Hypertension   . DDD (degenerative disc disease), cervical   . History of colonic polyps   . Diverticulosis     Past Surgical History  Procedure Laterality Date  . Inguinal hernia repair Left   . Benign tumor removed      knee     Current Outpatient Prescriptions  Medication Sig Dispense Refill  . chlorthalidone (HYGROTON) 25 MG tablet Take 25 mg by mouth daily.     Marland Kitchen losartan (COZAAR) 100 MG tablet Take 100 mg by mouth daily.     Marland Kitchen lovastatin (MEVACOR) 40 MG tablet Take 40 mg by mouth at bedtime.     Marland Kitchen PROAIR HFA 108 (90 BASE) MCG/ACT inhaler      No current facility-administered medications for this visit.    Allergies:   Penicillins and Septra    Social History:  The patient  reports that he has never smoked. He has never used smokeless tobacco. He reports that he drinks about 1.2 oz of alcohol per week. He reports that he does not use illicit drugs.   Family History:  The patient's  family history includes CAD (age of onset: 76) in his father; Cerebral aneurysm in his mother; Heart attack (age of onset: 22) in his brother. There is no history of Colon cancer or Stomach cancer.    ROS:  Please see the history of present illness.   Otherwise, review of systems are positive for none.   All other systems are reviewed and negative.    PHYSICAL EXAM: VS:  BP 132/70 mmHg  Pulse 67  Ht 6\' 1"  (1.854 m)  Wt 263 lb 8 oz (119.523 kg)  BMI 34.77 kg/m2 , BMI Body mass index is 34.77 kg/(m^2). GEN: Well nourished, well developed, in no acute distress HEENT: normal Neck: no JVD, carotid bruits, or masses Cardiac: RRR; no murmurs, rubs, or gallops,no edema  Respiratory:  clear to auscultation bilaterally, normal work of breathing GI: soft, nontender, nondistended, + BS MS: no deformity or atrophy Skin: warm and dry, no rash Neuro:  Strength and sensation are intact Psych: euthymic mood, full affect   EKG:  EKG is ordered today. The ekg ordered today demonstrates normal sinus rhythm, rate 67, leftward axis, poor anterior R-wave progression, intervals within normal limits, no acute ST-T wave changes.   Wt Readings from Last  3 Encounters:  06/10/14 263 lb 8 oz (119.523 kg)  10/14/13 250 lb (113.399 kg)  10/22/12 250 lb (113.399 kg)      Other studies Reviewed: Additional studies/ records that were reviewed today include: I reviewed outside records provided by Dr. Sandi Mariscal.  This included labs. This included pulmonary function testing. I also reviewed an MRI within our system from 2015. Review of the above records demonstrates: results are recorded elsewhere. Pulmonary function testing was essentially normal. LDL was 98. Triglycerides 126.   ASSESSMENT AND PLAN:  DYSPNEA:  I will assess with a POET (Plain Old Exercise Treadmill).  I will bring the patient back for a POET (Plain Old Exercise Test). This will allow me to screen for obstructive coronary disease, risk stratify  and very importantly provide a prescription for exercise.  I would also like to screen with a coronary calcium score.  OBESITY:  The patient understands the need to lose weight with diet and exercise. We have discussed specific strategies for this.  DYSLIPIDEMIA:  The goal will be determined based on the testing above and in particular the coronary calcium score. If he has a lot of calcium I would suggest a goal LDL less than 70.  ASCENDING AORTIC ANEURYSM:  This is being followed by Dr. Roxan Hockey. I did review the MRI.   Current medicines are reviewed at length with the patient today.  The patient does not have concerns regarding medicines.  The following changes have been made:  no change  Labs/ tests ordered today include: POET (Plain Old Exercise Treadmill), CA score     Disposition:   FU with me as needed  Signed, Minus Breeding, MD  06/10/2014 10:28 AM    [facility]

## 2014-06-12 ENCOUNTER — Ambulatory Visit (INDEPENDENT_AMBULATORY_CARE_PROVIDER_SITE_OTHER)
Admission: RE | Admit: 2014-06-12 | Discharge: 2014-06-12 | Disposition: A | Discharge status: Home / Self Care | Payer: Medicare Other | Source: Ambulatory Visit | Attending: Cardiology | Admitting: Cardiology

## 2014-06-12 DIAGNOSIS — Z8249 Family history of ischemic heart disease and other diseases of the circulatory system: Secondary | ICD-10-CM

## 2014-06-12 DIAGNOSIS — I429 Cardiomyopathy, unspecified: Secondary | ICD-10-CM

## 2014-06-12 DIAGNOSIS — R0600 Dyspnea, unspecified: Secondary | ICD-10-CM

## 2014-06-24 ENCOUNTER — Other Ambulatory Visit: Payer: Self-pay | Admitting: *Deleted

## 2014-06-25 ENCOUNTER — Telehealth (HOSPITAL_COMMUNITY): Payer: Self-pay

## 2014-06-25 NOTE — Telephone Encounter (Signed)
Awaiting return call

## 2014-06-26 ENCOUNTER — Telehealth (HOSPITAL_COMMUNITY): Payer: Self-pay

## 2014-06-26 NOTE — Telephone Encounter (Signed)
Encounter complete. 

## 2014-06-30 ENCOUNTER — Ambulatory Visit (HOSPITAL_COMMUNITY)
Admission: RE | Admit: 2014-06-30 | Discharge: 2014-06-30 | Disposition: A | Discharge status: Home / Self Care | Payer: Medicare Other | Source: Ambulatory Visit | Attending: Cardiology | Admitting: Cardiology

## 2014-06-30 DIAGNOSIS — R0609 Other forms of dyspnea: Secondary | ICD-10-CM | POA: Diagnosis present

## 2014-06-30 DIAGNOSIS — R0600 Dyspnea, unspecified: Secondary | ICD-10-CM

## 2014-06-30 NOTE — Procedures (Signed)
Exercise Treadmill Test    Test  Exercise Tolerance Test Ordering MD: Marijo File, MD  Interpreting MD:   Unique Test No: 1 Treadmill:  1  Indication for ETT: exertional dyspnea  Contraindication to ETT: No   Stress Modality: exercise - treadmill  Cardiac Imaging Performed: non   Protocol: standard Bruce - maximal  Max BP:  190/74  Max MPHR (bpm):  154 85% MPR (bpm):  131  MPHR obtained (bpm):  131 % MPHR obtained:  85  Reached 85% MPHR (min:sec):  4:20 Total Exercise Time (min-sec): 4:21  Workload in METS:  6.20 Borg Scale: Not recorded.    Reason ETT Terminated: exertional dyspnea    ST Segment Analysis At Rest: normal ST segments - no evidence of significant ST depression With Exercise: no evidence of significant ST depression  Other Information Arrhythmia:  No Angina during ETT:  absent (0) Quality of ETT:  diagnostic  ETT Interpretation:  normal - no evidence of ischemia by ST analysis  Comments: The patient had an poor exercise tolerance.  There was no chest pain.  There was an appropriate level of dyspnea.  There were no arrhythmias, a normal heart rate response and normal BP response.  There were no ischemic ST T wave changes and a normal heart rate recovery.   Recommendations: The patient is to have a coronary calcium score.  From this study there is no evidence of obstructive CAD.

## 2014-07-01 ENCOUNTER — Telehealth: Payer: Self-pay | Admitting: Cardiology

## 2014-07-01 NOTE — Telephone Encounter (Signed)
Pt called in stating that he had a stress test done on 2/23 and he would like to know what his results were. Please f/u with pt  Thanks

## 2014-07-03 NOTE — Telephone Encounter (Signed)
Pt. Informed about his stress test again

## 2014-10-08 ENCOUNTER — Other Ambulatory Visit: Payer: Self-pay | Admitting: *Deleted

## 2014-10-08 DIAGNOSIS — I712 Thoracic aortic aneurysm, without rupture: Secondary | ICD-10-CM

## 2014-10-08 DIAGNOSIS — I7121 Aneurysm of the ascending aorta, without rupture: Secondary | ICD-10-CM

## 2014-10-09 ENCOUNTER — Other Ambulatory Visit: Payer: Self-pay | Admitting: *Deleted

## 2014-10-09 DIAGNOSIS — I712 Thoracic aortic aneurysm, without rupture: Secondary | ICD-10-CM

## 2014-10-09 DIAGNOSIS — I7121 Aneurysm of the ascending aorta, without rupture: Secondary | ICD-10-CM

## 2014-10-13 ENCOUNTER — Telehealth: Payer: Self-pay | Admitting: Cardiology

## 2014-10-13 NOTE — Telephone Encounter (Signed)
New message        Pt PCP would like to give pt a 81 mg Aspirin   is this ok???

## 2014-10-16 LAB — CREATININE, SERUM: Creat: 1.09 mg/dL (ref 0.50–1.35)

## 2014-10-16 LAB — BUN: BUN: 23 mg/dL (ref 6–23)

## 2014-10-20 ENCOUNTER — Ambulatory Visit (HOSPITAL_COMMUNITY)
Admission: RE | Admit: 2014-10-20 | Discharge: 2014-10-20 | Disposition: A | Discharge status: Home / Self Care | Payer: Medicare Other | Source: Ambulatory Visit | Attending: Thoracic Surgery (Cardiothoracic Vascular Surgery) | Admitting: Thoracic Surgery (Cardiothoracic Vascular Surgery)

## 2014-10-20 DIAGNOSIS — I712 Thoracic aortic aneurysm, without rupture: Secondary | ICD-10-CM

## 2014-10-20 DIAGNOSIS — I7121 Aneurysm of the ascending aorta, without rupture: Secondary | ICD-10-CM

## 2014-10-27 ENCOUNTER — Ambulatory Visit: Payer: Medicare Other | Admitting: Thoracic Surgery (Cardiothoracic Vascular Surgery)

## 2014-11-10 ENCOUNTER — Ambulatory Visit (INDEPENDENT_AMBULATORY_CARE_PROVIDER_SITE_OTHER): Payer: Medicare Other | Admitting: Thoracic Surgery (Cardiothoracic Vascular Surgery)

## 2014-11-10 ENCOUNTER — Encounter: Payer: Self-pay | Admitting: Thoracic Surgery (Cardiothoracic Vascular Surgery)

## 2014-11-10 ENCOUNTER — Ambulatory Visit
Admission: RE | Admit: 2014-11-10 | Discharge: 2014-11-10 | Disposition: A | Discharge status: Home / Self Care | Payer: Medicare Other | Source: Ambulatory Visit | Attending: Thoracic Surgery (Cardiothoracic Vascular Surgery) | Admitting: Thoracic Surgery (Cardiothoracic Vascular Surgery)

## 2014-11-10 VITALS — BP 117/74 | HR 84 | Resp 20 | Ht 73.0 in | Wt 250.0 lb

## 2014-11-10 DIAGNOSIS — I712 Thoracic aortic aneurysm, without rupture: Secondary | ICD-10-CM | POA: Diagnosis not present

## 2014-11-10 DIAGNOSIS — I7121 Aneurysm of the ascending aorta, without rupture: Secondary | ICD-10-CM

## 2014-11-10 MED ORDER — GADOBENATE DIMEGLUMINE 529 MG/ML IV SOLN
20.0000 mL | Freq: Once | INTRAVENOUS | Status: AC | PRN
  Administered 2014-11-10: 20 mL via INTRAVENOUS

## 2014-11-10 NOTE — Progress Notes (Signed)
      [address]       [place] [address]             [phone]       HPI:  Mr. Swim returns for annual follow-up visit.  He is a 64 year old gentleman with a known ascending aortic aneurysm. This was first discovered back in 2005.  It was originally around 4.2-4.3 cm in diameter. Dr. Sandi Mariscal diagnosed him with hypertension for the first time around that time. He has been on medication for that since then. The aneurysm measured 4.5 cm in diameter last year.  He has been feeling well over the past year. He denies chest pain, shortness of breath, and swelling in his legs. He remains active.  He is a lifelong nonsmoker.  Past Medical History  Diagnosis Date  . Hyperlipidemia   . Ascending aortic aneurysm     4.4 cm  . Hypertension   . DDD (degenerative disc disease), cervical   . History of colonic polyps   . Diverticulosis     Current Outpatient Prescriptions  Medication Sig Dispense Refill  . chlorthalidone (HYGROTON) 25 MG tablet Take 25 mg by mouth daily.     Marland Kitchen losartan (COZAAR) 100 MG tablet Take 100 mg by mouth daily.     Marland Kitchen lovastatin (MEVACOR) 40 MG tablet Take 40 mg by mouth at bedtime.     Marland Kitchen PROAIR HFA 108 (90 BASE) MCG/ACT inhaler      No current facility-administered medications for this visit.    Physical Exam BP 117/74 mmHg  Pulse 84  Resp 20  Ht 6\' 1"  (1.854 m)  Wt 250 lb (113.399 kg)  BMI 32.99 kg/m2  SpO1 34% 64 year old man in no acute distress Alert and oriented 3 with no focal deficits Well-developed and well-nourished Cardiac regular rate and rhythm with a 2/6 systolic murmur Lungs clear with equal muscle bilaterally No carotid bruits Pulses 2+ symmetrical bilaterally  Diagnostic Tests: MRA CHEST WITH OR WITHOUT CONTRAST  TECHNIQUE: Angiographic images of the chest were obtained using MRA technique without and with intravenous contrast.  CONTRAST: 31mL MULTIHANCE GADOBENATE DIMEGLUMINE 529 MG/ML IV  SOLN  COMPARISON: 10/14/2013 and chest CT 12/31/2014  FINDINGS: The aortic root at the sinuses of Valsalva measures roughly 4.0 cm and minimally changed from 2015. The mid ascending thoracic aorta measures up to 4.5 cm and unchanged. No evidence for an aortic dissection. Aortic arch measures up to 3.3 cm and stable. The mid descending thoracic aorta measures 3.0 cm and unchanged. Proximal pulmonary arteries are patent. Normal arch configuration and the great vessels are patent. Incidental imaging of the abdomen demonstrates that the celiac trunk and proximal SMA are patent.  There is no significant enlargement of the heart. No significant pericardial or pleural fluid.  IMPRESSION: Stable aneurysmal dilatation of the ascending thoracic aorta, measuring up to 4.5 cm.   Electronically Signed  By: Markus Daft M.D.  On: 11/10/2014 13:13        Impression: Mr. Mcphillips is a 64 year old man with a known 4.5 cm ascending aortic aneurysm. I personally reviewed the MR angiogram This is unchanged from his last scan a year ago.  His blood pressure is well controlled on his current regimen   Plan: Return in one year with MRA angiogram  Melrose Nakayama, MD [facility] [phone]

## 2015-04-15 ENCOUNTER — Encounter: Payer: Self-pay | Admitting: Internal Medicine

## 2015-06-02 ENCOUNTER — Encounter: Payer: Self-pay | Admitting: *Deleted

## 2015-06-14 ENCOUNTER — Encounter: Payer: Self-pay | Admitting: Cardiology

## 2015-06-14 ENCOUNTER — Ambulatory Visit (INDEPENDENT_AMBULATORY_CARE_PROVIDER_SITE_OTHER): Payer: Medicare Other | Admitting: Cardiology

## 2015-06-14 VITALS — BP 122/70 | HR 64 | Ht 74.0 in | Wt 255.5 lb

## 2015-06-14 DIAGNOSIS — I2510 Atherosclerotic heart disease of native coronary artery without angina pectoris: Secondary | ICD-10-CM | POA: Diagnosis not present

## 2015-06-14 DIAGNOSIS — R931 Abnormal findings on diagnostic imaging of heart and coronary circulation: Secondary | ICD-10-CM | POA: Insufficient documentation

## 2015-06-14 NOTE — Patient Instructions (Signed)
Medication Instructions:  Your physician recommends that you continue on your current medications as directed. Please refer to the Current Medication list given to you today.   Labwork: none  Testing/Procedures: none  Follow-Up: Follow up with Dr. Hochrein as needed.   Any Other Special Instructions Will Be Listed Below (If Applicable).     If you need a refill on your cardiac medications before your next appointment, please call your pharmacy.   

## 2015-06-14 NOTE — Progress Notes (Signed)
Cardiology Office Note   Date:  06/14/2015   ID:  Troy Reed, DOB 09-13-1950, MRN [id number]  PCP:  Marylene Land, MD  Cardiologist:   Minus Breeding, MD   No chief complaint on file.     History of Present Illness: Troy Reed is a 65 y.o. male who presents for evaluation of dyspnea. I saw him last year and he had a stress test which was negative for ischemia.  He does have some coronary calcium. He was treated for bronchitis and is now on inhalers. He thinks he does well. 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 He doesn't have any PND or orthopnea. He doesn't have any palpitations, presyncope or syncope. He has no chest pressure, neck or arm discomfort.  Past Medical History  Diagnosis Date  . Hyperlipidemia   . Ascending aortic aneurysm (HCC)     4.4 cm  . Hypertension   . DDD (degenerative disc disease), cervical   . History of colonic polyps   . Diverticulosis     Past Surgical History  Procedure Laterality Date  . Inguinal hernia repair Left   . Benign tumor removed      knee     Current Outpatient Prescriptions  Medication Sig Dispense Refill  . chlorthalidone (HYGROTON) 25 MG tablet Take 25 mg by mouth daily.     Marland Kitchen losartan (COZAAR) 100 MG tablet Take 100 mg by mouth daily.     Marland Kitchen lovastatin (MEVACOR) 40 MG tablet Take 40 mg by mouth at bedtime.     Marland Kitchen PROAIR HFA 108 (90 BASE) MCG/ACT inhaler      No current facility-administered medications for this visit.    Allergies:   Penicillins and Septra     ROS:  Please see the history of present illness.   Otherwise, review of systems are positive for none.   All other systems are reviewed and negative.    PHYSICAL EXAM: VS:  BP 122/70 mmHg  Pulse 64  Ht 6\' 2"  (1.88 m)  Wt 255 lb 8 oz (115.894 kg)  BMI 32.79 kg/m2 , BMI Body mass index is 32.79 kg/(m^2). GENERAL:  Well appearing NECK:  No jugular venous distention, waveform within normal limits, carotid upstroke  brisk and symmetric, no bruits, no thyromegaly LUNGS:  Clear to auscultation bilaterally BACK:  No CVA tenderness CHEST:  Unremarkable HEART:  PMI not displaced or sustained,S1 and S2 within normal limits, no S3, no S4, no clicks, no rubs, no murmurs ABD:  Flat, positive bowel sounds normal in frequency in pitch, no bruits, no rebound, no guarding, no midline pulsatile mass, no hepatomegaly, no splenomegaly EXT:  2 plus pulses throughout, no edema, no cyanosis no clubbing    EKG:  EKG is ordered today. The ekg ordered today demonstrates normal sinus rhythm, rate 64, leftward axis, poor anterior R-wave progression, intervals within normal limits, no acute ST-T wave changes.   Wt Readings from Last 3 Encounters:  06/14/15 255 lb 8 oz (115.894 kg)  11/10/14 250 lb (113.399 kg)  06/10/14 263 lb 8 oz (119.523 kg)      Other studies Reviewed: Additional studies/ records that were reviewed today include: I reviewed outside records provided by Dr. Sandi Mariscal.  This included labs. This included pulmonary function testing. I also reviewed an MRI within our system from 2015. Review of the above records demonstrates: results are recorded elsewhere. Pulmonary function testing was essentially normal. LDL was 98. Triglycerides 126.  ASSESSMENT AND PLAN:  CORONARY CALCIUM:  At this point I don't think further cardiovascular testing is suggested. We talked about risk reduction. I will defer to Marylene Land, MD to have follow-up POET (Plain Old Exercise Treadmill) 2 years.   OBESITY:  He did have a little weight loss we talked about this further.  DYSLIPIDEMIA:  The goal will be determined based on the testing above and in particular the coronary calcium score. If he has a lot of calcium I would suggest a goal LDL less than 70.  ASCENDING AORTIC ANEURYSM:  This is being followed by Dr. Roxan Hockey.    Current medicines are reviewed at length with the patient today.  The patient does not have  concerns regarding medicines.  The following changes have been made:  no change  Labs/ tests ordered today include: None    Disposition:   FU with me as needed  Signed, Minus Breeding, MD  06/14/2015 9:05 AM    [place]

## 2015-11-03 ENCOUNTER — Other Ambulatory Visit: Payer: Self-pay | Admitting: Thoracic Surgery (Cardiothoracic Vascular Surgery)

## 2015-11-03 DIAGNOSIS — I7121 Aneurysm of the ascending aorta, without rupture: Secondary | ICD-10-CM

## 2015-11-03 DIAGNOSIS — I712 Thoracic aortic aneurysm, without rupture: Secondary | ICD-10-CM

## 2015-11-23 ENCOUNTER — Encounter: Payer: Self-pay | Admitting: Thoracic Surgery (Cardiothoracic Vascular Surgery)

## 2015-11-23 ENCOUNTER — Ambulatory Visit (INDEPENDENT_AMBULATORY_CARE_PROVIDER_SITE_OTHER): Payer: Medicare Other | Admitting: Thoracic Surgery (Cardiothoracic Vascular Surgery)

## 2015-11-23 ENCOUNTER — Ambulatory Visit
Admission: RE | Admit: 2015-11-23 | Discharge: 2015-11-23 | Disposition: A | Discharge status: Home / Self Care | Payer: Medicare Other | Source: Ambulatory Visit | Attending: Thoracic Surgery (Cardiothoracic Vascular Surgery) | Admitting: Thoracic Surgery (Cardiothoracic Vascular Surgery)

## 2015-11-23 VITALS — BP 150/76 | HR 72 | Resp 20 | Ht 74.0 in | Wt 235.0 lb

## 2015-11-23 DIAGNOSIS — I712 Thoracic aortic aneurysm, without rupture: Secondary | ICD-10-CM | POA: Diagnosis not present

## 2015-11-23 DIAGNOSIS — I7121 Aneurysm of the ascending aorta, without rupture: Secondary | ICD-10-CM

## 2015-11-23 DIAGNOSIS — I2510 Atherosclerotic heart disease of native coronary artery without angina pectoris: Secondary | ICD-10-CM | POA: Diagnosis not present

## 2015-11-23 DIAGNOSIS — I10 Essential (primary) hypertension: Secondary | ICD-10-CM

## 2015-11-23 MED ORDER — GADOBENATE DIMEGLUMINE 529 MG/ML IV SOLN
20.0000 mL | Freq: Once | INTRAVENOUS | Status: AC | PRN
  Administered 2015-11-23: 20 mL via INTRAVENOUS

## 2015-11-23 NOTE — Progress Notes (Signed)
[address]       [place] [address]             [phone]       HPI: [name] returns for a scheduled 1 year follow-up visit.  He is a 65 year old man with a history of hypertension, hyperlipidemia, heart murmur, and a known ascending aortic aneurysm. I last saw him in the office in July 2016. The aneurysm measured 4.5 cm at that time. It originally was 4.2-4.3 cm back in 2005.  Over the past year he has felt well. He is not having any chest pain, pressure, or tightness. He gets short of breath with heavy exertion, but that has not changed recently. He denies any significant swelling in his legs. He also denies orthopnea and paroxysmal nocturnal dyspnea.  Past Medical History  Diagnosis Date  . Hyperlipidemia   . Ascending aortic aneurysm (HCC)     4.4 cm  . Hypertension   . DDD (degenerative disc disease), cervical   . History of colonic polyps   . Diverticulosis       Current Outpatient Prescriptions  Medication Sig Dispense Refill  . chlorthalidone (HYGROTON) 25 MG tablet Take 25 mg by mouth daily.     Marland Kitchen losartan (COZAAR) 100 MG tablet Take 100 mg by mouth daily.     Marland Kitchen lovastatin (MEVACOR) 40 MG tablet Take 40 mg by mouth at bedtime.     Marland Kitchen PROAIR HFA 108 (90 BASE) MCG/ACT inhaler      No current facility-administered medications for this visit.    Physical Exam BP 150/76 mmHg  Pulse 72  Resp 20  Ht 6\' 2"  (1.88 m)  Wt 235 lb (106.595 kg)  BMI 30.16 kg/m2  SpO23 10% 65 year old man in no acute distress Alert and oriented 3 with no focal deficits No carotid bruits Cardiac regular rate and rhythm with a 2/6 systolic murmur right upper sternal border Lungs clear with equal breath sounds bilaterally 2+ pulses throughout  Diagnostic Tests: MRA CHEST WITH OR WITHOUT CONTRAST  TECHNIQUE: Angiographic images of the chest were obtained using MRA technique without and with intravenous contrast.  CONTRAST: 50mL MULTIHANCE GADOBENATE  DIMEGLUMINE 529 MG/ML IV SOLN  COMPARISON: 11/10/2014 as well as multiple additional prior studies.  FINDINGS: Stable aneurysmal disease of the ascending thoracic aorta which measures 4.5 cm in greatest diameter. No involvement of the sinuses of Valsalva. The aorta at the level of the arch and descending thoracic segment show normal caliber. Proximal great vessels are normally patent and demonstrate normal branching anatomy. No evidence of aortic dissection or intramural hemorrhage.  The heart size is normal. No other incidental findings identified in the chest.  IMPRESSION: Stable mild aneurysmal disease of the ascending thoracic aorta which measures 4.5 cm in greatest diameter.   Electronically Signed  By: Aletta Edouard M.D.  On: 11/23/2015 13:39  I personally reviewed the MR chest and concur with the findings as noted above.  Impression: [name] is a 65 year old gentleman with hypertension who has a 4.5 cm ascending aneurysm.   Ascending aneurysm- is not significantly changed over the past year. Return in one year with MR angiogram  Hypertension- his blood pressure was elevated at 150/76 today. His blood pressure is been very well controlled on his current regimen and he has not had any recent high blood pressure readings. He has a cuff for home and is going to check that on a regular basis over the next several weeks.  ABCs numbers greater than [id number] systolic are greater than 90 diastolic he will talk to Dr. Sandi Mariscal about possible medication adjustments.  Plan: Return in one year with MR angiogram of chest  Melrose Nakayama, MD [facility] [phone]

## 2016-06-01 ENCOUNTER — Ambulatory Visit
Admission: RE | Admit: 2016-06-01 | Discharge: 2016-06-01 | Disposition: A | Discharge status: Home / Self Care | Payer: Medicare Other | Source: Ambulatory Visit | Attending: Family Medicine | Admitting: Family Medicine

## 2016-06-01 ENCOUNTER — Other Ambulatory Visit: Payer: Self-pay | Admitting: Family Medicine

## 2016-06-01 DIAGNOSIS — M25512 Pain in left shoulder: Secondary | ICD-10-CM

## 2016-10-18 ENCOUNTER — Other Ambulatory Visit: Payer: Self-pay | Admitting: Thoracic Surgery (Cardiothoracic Vascular Surgery)

## 2016-10-18 DIAGNOSIS — I7121 Aneurysm of the ascending aorta, without rupture: Secondary | ICD-10-CM

## 2016-10-18 DIAGNOSIS — I712 Thoracic aortic aneurysm, without rupture: Secondary | ICD-10-CM

## 2016-11-23 ENCOUNTER — Encounter: Payer: Self-pay | Admitting: Internal Medicine

## 2016-11-28 ENCOUNTER — Ambulatory Visit (INDEPENDENT_AMBULATORY_CARE_PROVIDER_SITE_OTHER): Payer: Medicare Other | Admitting: Thoracic Surgery (Cardiothoracic Vascular Surgery)

## 2016-11-28 ENCOUNTER — Ambulatory Visit
Admission: RE | Admit: 2016-11-28 | Discharge: 2016-11-28 | Disposition: A | Discharge status: Home / Self Care | Payer: Medicare Other | Source: Ambulatory Visit | Attending: Thoracic Surgery (Cardiothoracic Vascular Surgery) | Admitting: Thoracic Surgery (Cardiothoracic Vascular Surgery)

## 2016-11-28 ENCOUNTER — Encounter: Payer: Self-pay | Admitting: Thoracic Surgery (Cardiothoracic Vascular Surgery)

## 2016-11-28 VITALS — BP 114/64 | HR 69 | Resp 20 | Ht 74.0 in | Wt 221.0 lb

## 2016-11-28 DIAGNOSIS — I712 Thoracic aortic aneurysm, without rupture: Secondary | ICD-10-CM | POA: Diagnosis not present

## 2016-11-28 DIAGNOSIS — I7121 Aneurysm of the ascending aorta, without rupture: Secondary | ICD-10-CM

## 2016-11-28 MED ORDER — GADOBENATE DIMEGLUMINE 529 MG/ML IV SOLN
20.0000 mL | Freq: Once | INTRAVENOUS | Status: AC | PRN
  Administered 2016-11-28: 20 mL via INTRAVENOUS

## 2016-11-28 NOTE — Progress Notes (Signed)
[address]       [place] [address]             [phone]     HPI: Mr. Holsworth returns for a scheduled annual follow-up visit  Troy Reed is a 66 year old male with history of hypertension, hyperlipidemia, heart murmur, and an ascending aneurysm. His ascending aneurysm was first discovered in 2005. It was 4.2-4.3 cm at that time. On his most recent visit in July 2017 the aneurysm was 4.5 cm.  He feels well. He has not had any significant medical issues over the past year. He denies any chest pain, pressure, or tightness. He denies shortness of breath except with very heavy exertion. He denies orthopnea, paroxysmal nocturnal dyspnea, and peripheral edema.  Past Medical History:  Diagnosis Date  . Ascending aortic aneurysm (HCC)    4.4 cm  . DDD (degenerative disc disease), cervical   . Diverticulosis   . History of colonic polyps   . Hyperlipidemia   . Hypertension      Past Medical History:  Diagnosis Date  . Ascending aortic aneurysm (HCC)    4.4 cm  . DDD (degenerative disc disease), cervical   . Diverticulosis   . History of colonic polyps   . Hyperlipidemia   . Hypertension     Current Outpatient Prescriptions  Medication Sig Dispense Refill  . chlorthalidone (HYGROTON) 25 MG tablet Take 25 mg by mouth daily.     Marland Kitchen losartan (COZAAR) 100 MG tablet Take 100 mg by mouth daily.     Marland Kitchen lovastatin (MEVACOR) 40 MG tablet Take 40 mg by mouth at bedtime.     Marland Kitchen PROAIR HFA 108 (90 BASE) MCG/ACT inhaler      No current facility-administered medications for this visit.     Physical Exam BP 114/64   Pulse 69   Resp 20   Ht 6\' 2"  (1.88 m)   Wt 221 lb (100.2 kg)   SpO2 96% Comment: RA  BMI 28.58 kg/m  66 year old man in no acute distress Alert and oriented 3 with no focal deficits No carotid bruits Cardiac regular rate and rhythm with a 2/6 systolic murmur Lungs clear breath sounds bilaterally 2+ pulses throughout  Diagnostic Tests: MRA CHEST  WITH OR WITHOUT CONTRAST  TECHNIQUE: Angiographic images of the chest were obtained using MRA technique with intravenous contrast.  Creatinine was obtained on site at [facility] at [address]  Results: Creatinine 1.2 mg/dL.  CONTRAST:  90mL MULTIHANCE GADOBENATE DIMEGLUMINE 529 MG/ML IV SOLN  COMPARISON:  Most recent prior MRA chest 11/23/2015  FINDINGS: VASCULAR  Aorta: Stable mild aneurysmal dilatation of the tubular portion of the ascending thoracic aorta with a maximal diameter of 4.5 cm. No evidence of dissection or new abnormality. Conventional 3 vessel arch anatomy. The aortic root remains normal in caliber. No effacement of the sino-tubular junction. The transverse and descending thoracic aorta remain normal in caliber.  Heart: The heart is normal in size.  No pericardial effusion.  Pulmonary Arteries: Normal in size. No evidence of central pulmonary embolus.  Other: None  NON-VASCULAR  No focal signal abnormality or abnormal enhancement within the nonvascular structures visualized throughout the thorax and in the upper abdomen.  IMPRESSION: VASCULAR  Stable 4.5 cm fusiform aneurysmal dilatation of the tubular portion of the ascending thoracic aorta.  NON-VASCULAR  No focal abnormality or unexpected finding.  Signed,  Criselda Peaches, MD  [facility]  [facility]  Radiology   Electronically Signed   By: Jacqulynn Cadet M.D.   On: 11/28/2016 10:31 I personally reviewed the MR images and concur with the findings noted above.  Impression: Ascending aneurysm- stable at 4.5 cm. Needs continued annual follow-up.  Hypertension- well-controlled currently  Plan: Return in one year with MRA angiogram  Melrose Nakayama, MD [facility] [phone]

## 2016-12-07 ENCOUNTER — Ambulatory Visit
Admission: RE | Admit: 2016-12-07 | Discharge: 2016-12-07 | Disposition: A | Discharge status: Home / Self Care | Payer: Medicare Other | Source: Ambulatory Visit | Attending: Family Medicine | Admitting: Family Medicine

## 2016-12-07 ENCOUNTER — Other Ambulatory Visit: Payer: Self-pay | Admitting: Family Medicine

## 2016-12-07 DIAGNOSIS — M79602 Pain in left arm: Secondary | ICD-10-CM

## 2016-12-07 DIAGNOSIS — M542 Cervicalgia: Secondary | ICD-10-CM

## 2016-12-18 ENCOUNTER — Encounter: Payer: Self-pay | Admitting: Internal Medicine

## 2017-02-27 ENCOUNTER — Ambulatory Visit (AMBULATORY_SURGERY_CENTER): Payer: Self-pay | Admitting: *Deleted

## 2017-02-27 VITALS — Ht 73.0 in | Wt 215.0 lb

## 2017-02-27 DIAGNOSIS — Z86010 Personal history of colonic polyps: Secondary | ICD-10-CM

## 2017-02-27 NOTE — Progress Notes (Signed)
No egg or soy allergy known to patient  No issues with past sedation with any surgeries  or procedures, no intubation problems  No diet pills per patient No home 02 use per patient  No blood thinners per patient  Pt denies issues with constipation  No A fib or A flutter  EMMI video sent to pt's e mail pt declined   

## 2017-03-06 ENCOUNTER — Ambulatory Visit (AMBULATORY_SURGERY_CENTER): Payer: Medicare Other | Admitting: Internal Medicine

## 2017-03-06 ENCOUNTER — Encounter: Payer: Self-pay | Admitting: Internal Medicine

## 2017-03-06 VITALS — BP 123/78 | HR 65 | Temp 98.0°F | Resp 13 | Ht 73.0 in | Wt 215.0 lb

## 2017-03-06 DIAGNOSIS — K621 Rectal polyp: Secondary | ICD-10-CM | POA: Diagnosis not present

## 2017-03-06 DIAGNOSIS — D128 Benign neoplasm of rectum: Secondary | ICD-10-CM

## 2017-03-06 DIAGNOSIS — Z86010 Personal history of colonic polyps: Secondary | ICD-10-CM | POA: Diagnosis not present

## 2017-03-06 DIAGNOSIS — D129 Benign neoplasm of anus and anal canal: Secondary | ICD-10-CM

## 2017-03-06 MED ORDER — SODIUM CHLORIDE 0.9 % IV SOLN: 500.0000 mL | INTRAVENOUS | Status: DC

## 2017-03-06 NOTE — Progress Notes (Signed)
Called to room to assist during endoscopic procedure.  Patient ID and intended procedure confirmed with present staff. Received instructions for my participation in the procedure from the performing physician.  

## 2017-03-06 NOTE — Progress Notes (Signed)
Spontaneous respirations throughout. VSS. Resting comfortably. To PACU on room air. Report to  RN. 

## 2017-03-06 NOTE — Patient Instructions (Addendum)
I found and removed one tiny rectal polyp. Also saw diverticulosis again. You have internal hemorrhoids also If you have hemorrhoid problems (swelling, itching, bleeding) I am able to treat those with an in-office procedure. If you like, please call my office at [phone] to schedule an appointment and I can evaluate you further.  I will let you know pathology results and when to have another routine colonoscopy by mail and/or My Chart.  I appreciate the opportunity to care for you. Gatha Mayer, MD, FACG  YOU HAD AN ENDOSCOPIC PROCEDURE TODAY AT [facility]:   Refer to the procedure report that was given to you for any specific questions about what was found during the examination.  If the procedure report does not answer your questions, please call your gastroenterologist to clarify.  If you requested that your care partner not be given the details of your procedure findings, then the procedure report has been included in a sealed envelope for you to review at your convenience later.  YOU SHOULD EXPECT: Some feelings of bloating in the abdomen. Passage of more gas than usual.  Walking can help get rid of the air that was put into your GI tract during the procedure and reduce the bloating. If you had a lower endoscopy (such as a colonoscopy or flexible sigmoidoscopy) you may notice spotting of blood in your stool or on the toilet paper. If you underwent a bowel prep for your procedure, you may not have a normal bowel movement for a few days.  Please Note:  You might notice some irritation and congestion in your nose or some drainage.  This is from the oxygen used during your procedure.  There is no need for concern and it should clear up in a day or so.  SYMPTOMS TO REPORT IMMEDIATELY:   Following lower endoscopy (colonoscopy or flexible sigmoidoscopy):  Excessive amounts of blood in the stool  Significant tenderness or worsening of abdominal pains  Swelling of the  abdomen that is new, acute  Fever of 100F or higher  For urgent or emergent issues, a gastroenterologist can be reached at any hour by calling [phone].   DIET:  We do recommend a small meal at first, but then you may proceed to your regular diet.  Drink plenty of fluids but you should avoid alcoholic beverages for 24 hours.  ACTIVITY:  You should plan to take it easy for the rest of today and you should NOT DRIVE or use heavy machinery until tomorrow (because of the sedation medicines used during the test).    FOLLOW UP: Our staff will call the number listed on your records the next business day following your procedure to check on you and address any questions or concerns that you may have regarding the information given to you following your procedure. If we do not reach you, we will leave a message.  However, if you are feeling well and you are not experiencing any problems, there is no need to return our call.  We will assume that you have returned to your regular daily activities without incident.  If any biopsies were taken you will be contacted by phone or by letter within the next 1-3 weeks.  Please call us at [phone] if you have not heard about the biopsies in 3 weeks.    SIGNATURES/CONFIDENTIALITY: You and/or your care partner have signed paperwork which will be entered into your electronic medical record.  These signatures attest to the  fact that that the information above on your After Visit Summary has been reviewed and is understood.  Full responsibility of the confidentiality of this discharge information lies with you and/or your care-partner.

## 2017-03-06 NOTE — Progress Notes (Signed)
Pt's states no medical or surgical changes since previsit or office visit. 

## 2017-03-06 NOTE — Op Note (Signed)
Troy Reed Procedure Date: 03/06/2017 9:55 AM MRN: [id number] Endoscopist: Gatha Mayer , MD Age: 66 Referring MD:  Date of Birth: Sep 27, 1950 Gender: Male Account #: [id number] Procedure:                Colonoscopy Indications:              Surveillance: Personal history of adenomatous                            polyps on last colonoscopy 5 years ago Medicines:                Propofol per Anesthesia, Monitored Anesthesia Care Procedure:                Pre-Anesthesia Assessment:                           - Prior to the procedure, a History and Physical                            was performed, and patient medications and                            allergies were reviewed. The patient's tolerance of                            previous anesthesia was also reviewed. The risks                            and benefits of the procedure and the sedation                            options and risks were discussed with the patient.                            All questions were answered, and informed consent                            was obtained. Prior Anticoagulants: The patient has                            taken no previous anticoagulant or antiplatelet                            agents. ASA Grade Assessment: II - A patient with                            mild systemic disease. After reviewing the risks                            and benefits, the patient was deemed in                            satisfactory condition to undergo the procedure.  After obtaining informed consent, the colonoscope                            was passed under direct vision. Throughout the                            procedure, the patient's blood pressure, pulse, and                            oxygen saturations were monitored continuously. The                            Model CF-HQ190L [id number]) scope was introduced   through the anus and advanced to the the cecum,                            identified by appendiceal orifice and ileocecal                            valve. The colonoscopy was performed without                            difficulty. The patient tolerated the procedure                            well. The quality of the bowel preparation was                            good. The ileocecal valve, appendiceal orifice, and                            rectum were photographed. The ileocecal valve,                            appendiceal orifice, and rectum were photographed. Scope In: 10:05:16 AM Scope Out: 10:24:42 AM Scope Withdrawal Time: 0 hours 14 minutes 59 seconds  Total Procedure Duration: 0 hours 19 minutes 26 seconds  Findings:                 The perianal and digital rectal examinations were                            normal.                           A diminutive polyp was found in the rectum. The                            polyp was sessile. The polyp was removed with a                            cold snare. Resection and retrieval were complete.                            Verification of patient  identification for the                            specimen was done. Estimated blood loss was minimal.                           Multiple small and large-mouthed diverticula were                            found in the entire colon.                           Internal hemorrhoids were found during retroflexion.                           The exam was otherwise without abnormality on                            direct and retroflexion views. Complications:            No immediate complications. Estimated Blood Loss:     Estimated blood loss was minimal. Impression:               - One diminutive polyp in the rectum, removed with                            a cold snare. Resected and retrieved.                           - Moderate diverticulosis in the entire examined                             colon.                           - Internal hemorrhoids.                           - The examination was otherwise normal on direct                            and retroflexion views.                           - Personal history of colonic polyps. Recommendation:           - Patient has a contact number available for                            emergencies. The signs and symptoms of potential                            delayed complications were discussed with the                            patient. Return to normal activities tomorrow.  Written discharge instructions were provided to the                            patient.                           - Resume previous diet.                           - Continue present medications.                           - Repeat colonoscopy is recommended for                            surveillance. The colonoscopy date will be                            determined after pathology results from today's                            exam become available for review. Gatha Mayer, MD 03/06/2017 10:35:06 AM This report has been signed electronically.

## 2017-03-07 ENCOUNTER — Telehealth: Payer: Self-pay | Admitting: *Deleted

## 2017-03-07 NOTE — Telephone Encounter (Signed)
  Follow up Call-  Call back number 03/06/2017  Post procedure Call Back phone  # [phone] TO TALK TO WIFE  Permission to leave phone message Yes  Some recent data might be hidden     Patient questions:  Do you have a fever, pain , or abdominal swelling? No. Pain Score  0 *  Have you tolerated food without any problems? Yes.    Have you been able to return to your normal activities? Yes.    Do you have any questions about your discharge instructions: Diet   No. Medications  No. Follow up visit  No.  Do you have questions or concerns about your Care? No.  Actions: * If pain score is 4 or above: No action needed, pain <4.

## 2017-03-09 ENCOUNTER — Encounter: Payer: Self-pay | Admitting: Internal Medicine

## 2017-03-09 NOTE — Progress Notes (Signed)
Hyperplastic polyps Recall 2023 remote large villous adenoma

## 2017-10-17 ENCOUNTER — Other Ambulatory Visit: Payer: Self-pay | Admitting: *Deleted

## 2017-10-17 DIAGNOSIS — I712 Thoracic aortic aneurysm, without rupture, unspecified: Secondary | ICD-10-CM

## 2017-12-04 ENCOUNTER — Other Ambulatory Visit: Payer: Medicare Other

## 2017-12-04 ENCOUNTER — Encounter: Payer: Medicare Other | Admitting: Thoracic Surgery (Cardiothoracic Vascular Surgery)

## 2017-12-18 ENCOUNTER — Ambulatory Visit
Admission: RE | Admit: 2017-12-18 | Discharge: 2017-12-18 | Disposition: A | Discharge status: Home / Self Care | Payer: Medicare Other | Source: Ambulatory Visit | Attending: Thoracic Surgery (Cardiothoracic Vascular Surgery) | Admitting: Thoracic Surgery (Cardiothoracic Vascular Surgery)

## 2017-12-18 DIAGNOSIS — I712 Thoracic aortic aneurysm, without rupture, unspecified: Secondary | ICD-10-CM

## 2017-12-18 MED ORDER — GADOBENATE DIMEGLUMINE 529 MG/ML IV SOLN
20.0000 mL | Freq: Once | INTRAVENOUS | Status: AC | PRN
  Administered 2017-12-18: 20 mL via INTRAVENOUS

## 2017-12-21 ENCOUNTER — Other Ambulatory Visit: Payer: Self-pay | Admitting: Family Medicine

## 2017-12-21 ENCOUNTER — Ambulatory Visit
Admission: RE | Admit: 2017-12-21 | Discharge: 2017-12-21 | Disposition: A | Discharge status: Home / Self Care | Payer: Medicare Other | Source: Ambulatory Visit | Attending: Family Medicine | Admitting: Family Medicine

## 2017-12-21 DIAGNOSIS — M25472 Effusion, left ankle: Secondary | ICD-10-CM

## 2017-12-21 DIAGNOSIS — M25572 Pain in left ankle and joints of left foot: Secondary | ICD-10-CM

## 2017-12-25 ENCOUNTER — Other Ambulatory Visit: Payer: Self-pay

## 2017-12-25 ENCOUNTER — Ambulatory Visit (INDEPENDENT_AMBULATORY_CARE_PROVIDER_SITE_OTHER): Payer: Medicare Other | Admitting: Thoracic Surgery (Cardiothoracic Vascular Surgery)

## 2017-12-25 ENCOUNTER — Encounter: Payer: Self-pay | Admitting: Thoracic Surgery (Cardiothoracic Vascular Surgery)

## 2017-12-25 VITALS — BP 133/76 | HR 67 | Resp 16 | Ht 73.0 in | Wt 220.0 lb

## 2017-12-25 DIAGNOSIS — I712 Thoracic aortic aneurysm, without rupture: Secondary | ICD-10-CM

## 2017-12-25 DIAGNOSIS — I7121 Aneurysm of the ascending aorta, without rupture: Secondary | ICD-10-CM

## 2017-12-25 NOTE — Progress Notes (Signed)
[address]       [place] [address]             [phone]    HPI: Mr. Arizola returns for a scheduled follow up visit  Raeshawn Vo is a 67 year old man with a history of heart murmur, hypertension, hyperlipidemia, and an ascending aneurysm.  He was first noted to have an aneurysm in 2005.  It was about 4.3 cm at that time.  I have been following him since then recently it has been measuring 4.5 cm.  He feels well.  He has not had any significant medical issues.  He denies any chest pain, pressure, or tightness.  He denies shortness of breath, orthopnea and peripheral edema.   Past Medical History:  Diagnosis Date  . Ascending aortic aneurysm (HCC)    4.4 cm  . DDD (degenerative disc disease), cervical   . Diverticulosis   . History of colonic polyps   . Hyperlipidemia   . Hypertension     Current Outpatient Medications  Medication Sig Dispense Refill  . Fluticasone-Salmeterol (ADVAIR DISKUS IN) Inhale into the lungs.    Marland Kitchen losartan (COZAAR) 100 MG tablet Take 100 mg by mouth.    . lovastatin (MEVACOR) 40 MG tablet Take 40 mg by mouth at bedtime.      No current facility-administered medications for this visit.     Physical Exam BP 133/76 (BP Location: Right Arm, Patient Position: Sitting, Cuff Size: Large)   Pulse 67   Resp 16   Ht 6\' 1"  (1.854 m)   Wt 220 lb (99.8 kg)   SpO2 95% Comment: RA  BMI 67.55 kg/m  87-year-old man in no acute distress Alert and oriented x3 with no focal deficits Lungs clear with equal breath sounds bilaterally Cardiac regular rate and rhythm 2/6 systolic murmur right upper sternal border 2+ pulses, no carotid bruits  Diagnostic Tests: MRA CHEST WITH OR WITHOUT CONTRAST  TECHNIQUE: Preliminary sequences through the chest were obtained without contrast. Angiographic images of the chest were obtained using MRA technique with intravenous contrast.  CONTRAST:  51mL MULTIHANCE GADOBENATE DIMEGLUMINE 529 MG/ML IV  SOLN  Creatinine was obtained on site at [facility] at [address]  Results: Creatinine 1.1 mg/dL.  Estimated GFR 68 mL/minute  COMPARISON:  11/28/2016, 11/23/2015, 11/10/2014, 10/14/2013, 10/21/2012, 10/19/2011 and 11/02/2010  FINDINGS: VASCULAR  Aorta: There is stable dilatation of the ascending thoracic aorta measuring 4.5 cm in greatest diameter. Caliber of the aortic root at the sinuses of Valsalva may be slightly greater than on prior studies and measures roughly 4.3-4.4 cm. This is a difficult region to assess accurately with MRI given aortic motion. However, when reviewing prior studies it does appear that the aortic root may be mildly more prominent in caliber over time, especially dating back to studies around 2013 and 2012. Correlation with echocardiography may be helpful to assess aortic valve function.  Other aortic dimensions are stable. The proximal arch measures 3.9 cm, the distal arch 3 cm and the descending thoracic aorta 2.5-2.9 cm. The aortic arch is again noted to be very elongated. No evidence of aortic dissection. Proximal great vessels show normal branching anatomy and are normally patent.  Heart: The heart size is normal and appears stable over time. No evidence of pericardial fluid.  Pulmonary Arteries: Central pulmonary arteries are normal in caliber.  NON-VASCULAR  No other additional nonvascular findings. No pleural fluid identified. No evidence of lymphadenopathy or focal mass  lesions. Visualized bony structures appear unremarkable.  IMPRESSION: 1. Stable dilatation of the tubular portion of the ascending thoracic aorta measuring 4.5 cm in greatest diameter. 2. It is felt that the aortic root may be slightly greater in caliber compared to prior studies measuring roughly 4.3-4.4 cm at the sinuses of Valsalva. Root measurement is somewhat difficult to accurately obtain on multiple studies due to variable  aortic pulsation artifact. However, the root does appear slightly larger compared to older studies dating back to 2012. Correlation with echocardiography may be helpful to evaluate aortic valve function.   Electronically Signed   By: Aletta Edouard M.D.   On: 12/18/2017 12:37 I personally reviewed the MR images and concur with the findings noted above  Impression: Mr. Ramdass is a 67 year old gentleman with a history of hypertension, hyperlipidemia, heart murmur, and a 4.5 cm ascending aneurysm.  This was first noted in 2005 and was about 4.2 to 4.3 cm at that time.  It has been stable at 4.5 cm over the last several years.  His MRI today shows no change in the ascending aneurysm.  There is some question about possible slight increase in size in the aortic root, but I suspect this is motion artifact.  It has been a while since we have done a CT angiogram so I think we will do that the next time around to see if that will give a better measurement in the root.  Hypertension-pressure well controlled on current regimen  Plan: Return in 6 months with CT angiogram  Melrose Nakayama, MD [facility] [phone]

## 2018-03-19 ENCOUNTER — Other Ambulatory Visit: Payer: Self-pay | Admitting: Family Medicine

## 2018-03-19 ENCOUNTER — Ambulatory Visit
Admission: RE | Admit: 2018-03-19 | Discharge: 2018-03-19 | Disposition: A | Discharge status: Home / Self Care | Payer: Medicare Other | Source: Ambulatory Visit | Attending: Family Medicine | Admitting: Family Medicine

## 2018-03-19 DIAGNOSIS — R059 Cough, unspecified: Secondary | ICD-10-CM

## 2018-03-19 DIAGNOSIS — R05 Cough: Secondary | ICD-10-CM

## 2018-05-17 ENCOUNTER — Other Ambulatory Visit: Payer: Self-pay | Admitting: *Deleted

## 2018-05-17 DIAGNOSIS — I712 Thoracic aortic aneurysm, without rupture, unspecified: Secondary | ICD-10-CM

## 2018-06-21 IMAGING — MR MR MRA CHEST W/ OR W/O CM
12 series · 16 of 16 positions shown · IV contrast (20 ml Multihance)
Comparison: Most recent prior MRA chest 11/23/2015

CLINICAL DATA: 66-year-old male with a history of thoracic aortic
aneurysm

EXAM:
MRA CHEST WITH OR WITHOUT CONTRAST
TECHNIQUE: Angiographic images of the chest were obtained using MRA technique
with intravenous contrast.
Creatinine was obtained on site at [HOSPITAL] at [HOSPITAL].
Results: Creatinine 1.2 mg/dL.
CONTRAST:  20mL MULTIHANCE GADOBENATE DIMEGLUMINE 529 MG/ML IV SOLN

[Series 2: bSSFP · axial · 6.0mm · 1.48mm/px · 1 of 31 slices shown (1 of 2)]
[im 1/31]
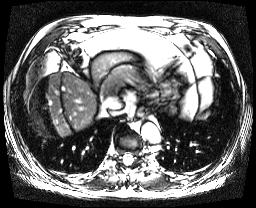

[Series 3: T2 · axial · 5.0mm · 1.48mm/px · 1 of 45 slices shown]
[im 1/45]
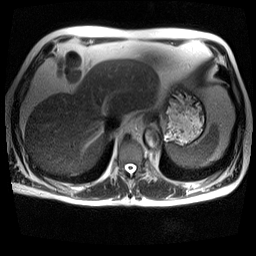

[Series 4: T1 · axial · 5.0mm · 1.48mm/px · 1 of 42 slices shown]
[im 1/42]
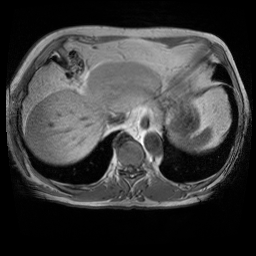

[Series 5: T1 dynamic · axial · non-contrast · 2.5mm · 0.74mm/px · 1 of 88 slices shown]
[im 1/88]
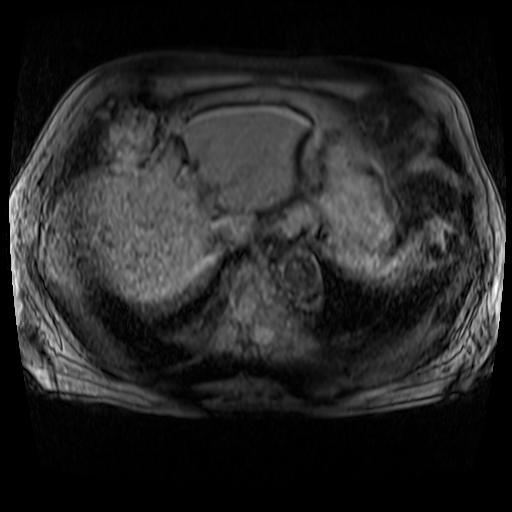

[Series 6: bSSFP · sagittal · 4.0mm · 1.64mm/px · 1 of 20 slices shown (2 of 2)]
[im 1/20]
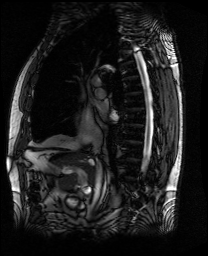

[Series 7: fl3d_cor_pre_tt=1.0s · sagittal · 1.5mm · 1.15mm/px · 1 of 104 slices shown]
[im 1/104]
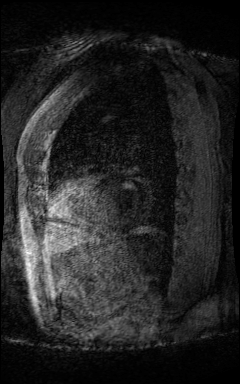

[Series 9: fl3d_cor_post_tt=1.0s · sagittal · 1.5mm · 1.15mm/px · 2 of 104 slices shown]
[im 1/104]
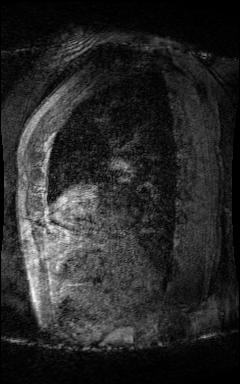
[im 104/104]
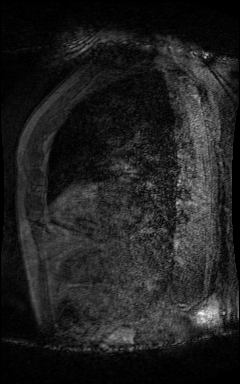

[Series 10: fl3d_cor_post_tt=1.0s_sub · sagittal · 1.5mm · 1.15mm/px · 2 of 102 slices shown]
[im 1/102]
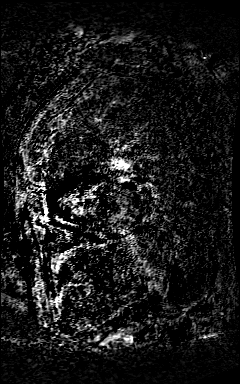
[im 102/102]
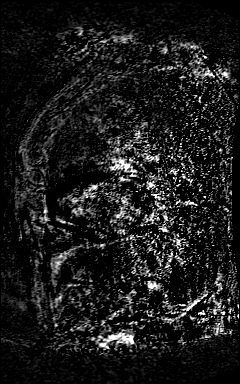

[Series 13: T1 fat-sat post-contrast · axial · 5.0mm · 1.48mm/px · 1 of 38 slices shown]
[im 1/38]
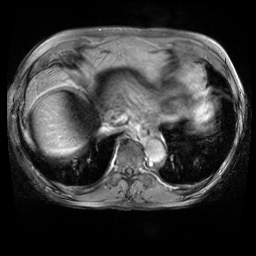

[Series 14: post flash candy · sagittal · 4.0mm · 1.48mm/px · 1 of 26 slices shown]
[im 1/26]
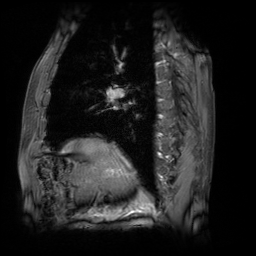

[Series 15: T1 dynamic post-contrast · axial · 2.5mm · 0.74mm/px · z∈[-64,+153]mm · 2 of 88 slices shown]
[im 1/88]
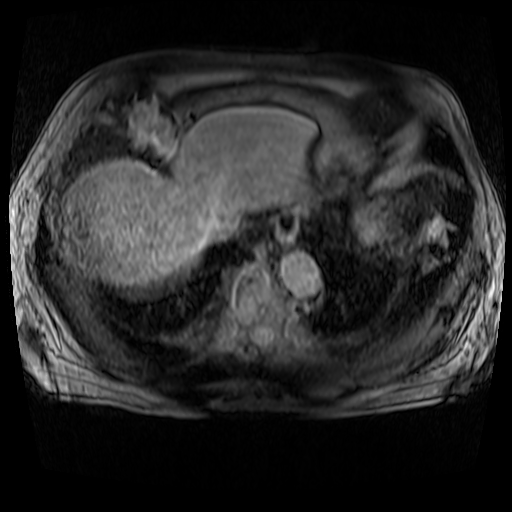
[im 88/88]
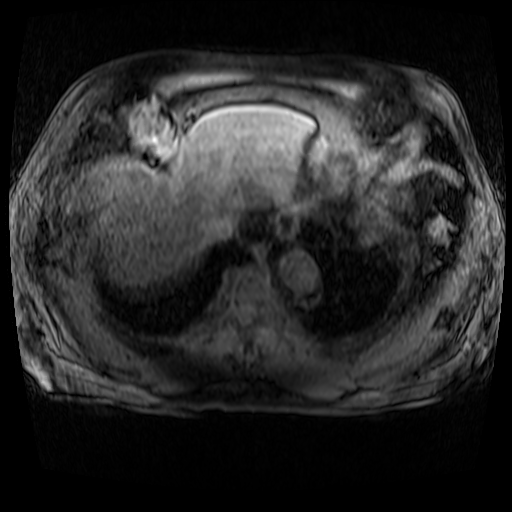

[Series 16: post vibe_sub · axial · 2.5mm · 0.74mm/px · z∈[-64,+153]mm · 2 of 88 slices shown]
[im 1/88]
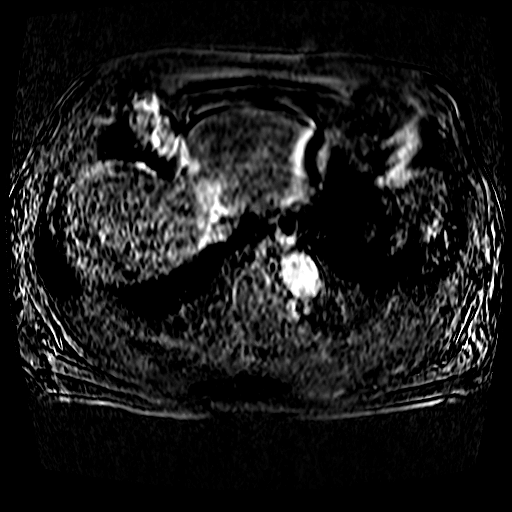
[im 88/88]
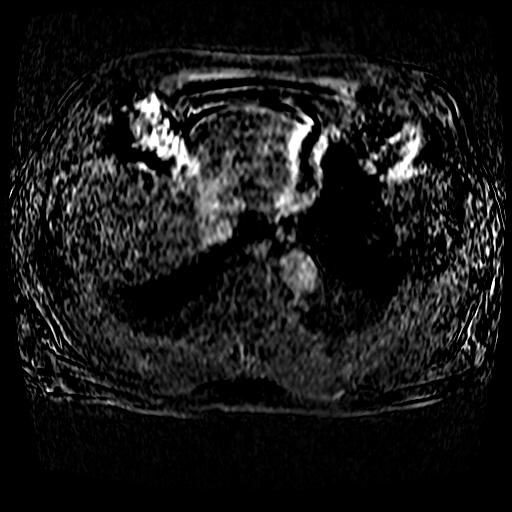

[16 of 16 positions shown; findings below may reference images not displayed]

FINDINGS: VASCULAR

Aorta: Stable mild aneurysmal dilatation of the tubular portion of
the ascending thoracic aorta with a maximal diameter of 4.5 cm. No
evidence of dissection or new abnormality. Conventional 3 vessel
arch anatomy. The aortic root remains normal in caliber. No
effacement of the Mcewen junction. The transverse and
descending thoracic aorta remain normal in caliber.

Heart: The heart is normal in size.  No pericardial effusion.

Pulmonary Arteries: Normal in size. No evidence of central pulmonary
embolus.

Other: None

NON-VASCULAR

No focal signal abnormality or abnormal enhancement within the
nonvascular structures visualized throughout the thorax and in the
upper abdomen.
IMPRESSION: VASCULAR

Stable 4.5 cm fusiform aneurysmal dilatation of the tubular portion
of the ascending thoracic aorta.

NON-VASCULAR

No focal abnormality or unexpected finding.

## 2018-06-28 ENCOUNTER — Ambulatory Visit
Admission: RE | Admit: 2018-06-28 | Discharge: 2018-06-28 | Disposition: A | Discharge status: Home / Self Care | Payer: Medicare Other | Source: Ambulatory Visit | Attending: Thoracic Surgery (Cardiothoracic Vascular Surgery) | Admitting: Thoracic Surgery (Cardiothoracic Vascular Surgery)

## 2018-06-28 ENCOUNTER — Other Ambulatory Visit: Payer: Medicare Other

## 2018-06-28 DIAGNOSIS — I712 Thoracic aortic aneurysm, without rupture, unspecified: Secondary | ICD-10-CM

## 2018-06-28 MED ORDER — IOPAMIDOL (ISOVUE-370) INJECTION 76%
75.0000 mL | Freq: Once | INTRAVENOUS | Status: AC | PRN
  Administered 2018-06-28: 75 mL via INTRAVENOUS

## 2018-07-02 ENCOUNTER — Ambulatory Visit (INDEPENDENT_AMBULATORY_CARE_PROVIDER_SITE_OTHER): Payer: Medicare Other | Admitting: Thoracic Surgery (Cardiothoracic Vascular Surgery)

## 2018-07-02 ENCOUNTER — Other Ambulatory Visit: Payer: Self-pay

## 2018-07-02 ENCOUNTER — Encounter: Payer: Self-pay | Admitting: Thoracic Surgery (Cardiothoracic Vascular Surgery)

## 2018-07-02 VITALS — BP 129/80 | HR 67 | Resp 16 | Ht 73.0 in | Wt 215.0 lb

## 2018-07-02 DIAGNOSIS — I712 Thoracic aortic aneurysm, without rupture, unspecified: Secondary | ICD-10-CM

## 2018-07-02 NOTE — Progress Notes (Signed)
[address]       [place] [address]             [phone]    HPI: Mr. Hobbins is for follow-up of his ascending aneurysm  Pericles Carmicheal is a 68 year old man with a past medical history of hypertension, hyperlipidemia, heart murmur, and ascending aneurysm.  His aneurysm was first found in 2005.  It measured 4.3 cm at that time.  He has been followed since then initially annually.  But his more recent scan showed the aorta to be approximately 4.5 cm so he now returns for a six-month follow-up.  He is currently finishing up a course of prednisone for bronchitis.  He has had a cough and some wheezing with that.  He says he is just about over that now.  He is not having any chest pain, pressure, or tightness.  Past Medical History:  Diagnosis Date  . Ascending aortic aneurysm (HCC)    4.4 cm  . DDD (degenerative disc disease), cervical   . Diverticulosis   . History of colonic polyps   . Hyperlipidemia   . Hypertension     Current Outpatient Medications  Medication Sig Dispense Refill  . Fluticasone-Salmeterol (ADVAIR DISKUS IN) Inhale into the lungs.    Marland Kitchen losartan (COZAAR) 100 MG tablet Take 100 mg by mouth.    . lovastatin (MEVACOR) 40 MG tablet Take 40 mg by mouth at bedtime.      No current facility-administered medications for this visit.     Physical Exam BP 129/80 (BP Location: Right Arm, Patient Position: Sitting, Cuff Size: Large)   Pulse 67   Resp 16   Ht 6\' 1"  (1.854 m)   Wt 215 lb (97.5 kg)   SpO2 95% Comment: ON RA  BMI 28.84 kg/m  69 year old man in no acute distress Well-developed and well-nourished Alert and oriented x3 with no focal deficits Lungs coarse rhonchi at bases, otherwise clear Cardiac regular rate and rhythm with a 2/6 systolic murmur No carotid bruits Pulses 2+ throughout  Diagnostic Tests: CT ANGIOGRAPHY CHEST WITH CONTRAST  TECHNIQUE: Multidetector CT imaging of the chest was performed using the standard protocol  during bolus administration of intravenous contrast. Multiplanar CT image reconstructions and MIPs were obtained to evaluate the vascular anatomy.  CONTRAST:  28mL ISOVUE-370 IOPAMIDOL (ISOVUE-370) INJECTION 76%  COMPARISON:  CT scan of January 20, 2017.  FINDINGS: Cardiovascular: Grossly stable 4.4 cm ascending thoracic aortic aneurysm is noted. Atherosclerosis of thoracic aorta is noted without dissection. Great vessels are widely patent without significant stenosis. Normal cardiac size. No pericardial effusion is noted. Coronary calcifications are noted.  Mediastinum/Nodes: No enlarged mediastinal, hilar, or axillary lymph nodes. Thyroid gland, trachea, and esophagus demonstrate no significant findings.  Lungs/Pleura: Lungs are clear. No pleural effusion or pneumothorax.  Upper Abdomen: No acute abnormality.  Musculoskeletal: No chest wall abnormality. No acute or significant osseous findings.  Review of the MIP images confirms the above findings.  IMPRESSION: Grossly stable 4.4 cm ascending thoracic aortic aneurysm. Recommend annual imaging followup by CTA or MRA. This recommendation follows 2010 ACCF/AHA/AATS/ACR/ASA/SCA/SCAI/SIR/STS/SVM Guidelines for the Diagnosis and Management of Patients with Thoracic Aortic Disease. Circulation. 2010; 121: G500-B704. Aortic aneurysm NOS (ICD10-I71.9).  Coronary artery calcifications are noted.  Aortic Atherosclerosis (ICD10-I70.0).   Electronically Signed   By: Marijo Conception, M.D.   On: 06/28/2018 15:45 I personally reviewed the CT images and concur with the findings noted above.  I get 4.5 cm  maximum diameter.  Impression: Mr. Malinak is a 68 year old man with history of hypertension, hyperlipidemia, heart murmur, and a 4.5 cm ascending aneurysm.  We have been following him for his aneurysm since 2005.  It was around 4.3 cm when first noted.  Ascending aneurysm-official reading was 4.4 cm, I get about 4.5 cm  on personal measurements.  His aortic root is not particularly enlarged.  I measured at approximately 4.1 cm.  That is within normal range for a gentleman of his age.  We will continue with semiannual follow-up of his ascending aneurysm.  We will do an MR angiogram at his next visit.  Hypertension-blood pressure well controlled on current regimen.  Thoracic aortic atherosclerosis-he is on a statin for that.  Plan: Return in 6 months with MR angiogram  Melrose Nakayama, MD [facility] [phone]

## 2018-10-30 ENCOUNTER — Other Ambulatory Visit: Payer: Self-pay | Admitting: *Deleted

## 2018-10-30 DIAGNOSIS — I712 Thoracic aortic aneurysm, without rupture, unspecified: Secondary | ICD-10-CM

## 2018-11-05 ENCOUNTER — Encounter (HOSPITAL_BASED_OUTPATIENT_CLINIC_OR_DEPARTMENT_OTHER): Payer: Self-pay

## 2018-11-05 DIAGNOSIS — G4733 Obstructive sleep apnea (adult) (pediatric): Secondary | ICD-10-CM

## 2018-11-05 DIAGNOSIS — R5383 Other fatigue: Secondary | ICD-10-CM

## 2018-12-16 ENCOUNTER — Ambulatory Visit
Admission: RE | Admit: 2018-12-16 | Discharge: 2018-12-16 | Disposition: A | Discharge status: Home / Self Care | Payer: Medicare Other | Source: Ambulatory Visit | Attending: Thoracic Surgery (Cardiothoracic Vascular Surgery) | Admitting: Thoracic Surgery (Cardiothoracic Vascular Surgery)

## 2018-12-16 DIAGNOSIS — I712 Thoracic aortic aneurysm, without rupture, unspecified: Secondary | ICD-10-CM

## 2018-12-16 MED ORDER — GADOBENATE DIMEGLUMINE 529 MG/ML IV SOLN
20.0000 mL | Freq: Once | INTRAVENOUS | Status: AC | PRN
  Administered 2018-12-16: 20 mL via INTRAVENOUS

## 2018-12-17 ENCOUNTER — Encounter: Payer: Self-pay | Admitting: Thoracic Surgery (Cardiothoracic Vascular Surgery)

## 2018-12-17 ENCOUNTER — Other Ambulatory Visit: Payer: Self-pay

## 2018-12-17 ENCOUNTER — Ambulatory Visit (INDEPENDENT_AMBULATORY_CARE_PROVIDER_SITE_OTHER): Payer: Medicare Other | Admitting: Thoracic Surgery (Cardiothoracic Vascular Surgery)

## 2018-12-17 VITALS — BP 137/81 | HR 55 | Temp 97.5°F | Resp 16 | Ht 73.0 in | Wt 220.0 lb

## 2018-12-17 DIAGNOSIS — I712 Thoracic aortic aneurysm, without rupture: Secondary | ICD-10-CM

## 2018-12-17 DIAGNOSIS — I7121 Aneurysm of the ascending aorta, without rupture: Secondary | ICD-10-CM

## 2018-12-17 NOTE — Progress Notes (Signed)
[address]       [place] [address]             [phone]       HPI: Mr. Ruhland returns for scheduled follow-up visit  Troy Reed is a 68 year old man with a history of hypertension, hyperlipidemia, heart murmur, thoracic aortic atherosclerosis, and an ascending aneurysm.  He was first found to have an aneurysm in 2005.  It was 4.3 cm.  I have been following him since then.  Most recently in February of this year his aneurysm measured 4.5 cm.  He has been doing well since his last visit.  He remains physically active.  He is not having any chest pain, pressure, tightness, shortness of breath, cough or wheezing.  Past Medical History:  Diagnosis Date   Ascending aortic aneurysm (HCC)    4.4 cm   DDD (degenerative disc disease), cervical    Diverticulosis    History of colonic polyps    Hyperlipidemia    Hypertension     Current Outpatient Medications  Medication Sig Dispense Refill   losartan (COZAAR) 100 MG tablet Take 100 mg by mouth.     lovastatin (MEVACOR) 40 MG tablet Take 40 mg by mouth at bedtime.      Fluticasone-Salmeterol (ADVAIR DISKUS IN) Inhale into the lungs.     No current facility-administered medications for this visit.     Physical Exam BP 137/81 (BP Location: Right Arm, Patient Position: Sitting, Cuff Size: Large)    Pulse (!) 55    Temp (!) 97.5 F (36.4 C)    Resp 16    Ht 6\' 1"  (1.854 m)    Wt 220 lb (99.8 kg)    SpO2 96% Comment: RA   BMI 29.20 kg/m  68 year old man in no acute distress Alert and oriented x3 with no focal deficits No carotid bruits Cardiac regular rate and rhythm normal S1 and S2, 2/6 systolic murmur Lungs clear with equal breath sounds bilaterally Pulses 2+ and symmetric  Diagnostic Tests: MRA CHEST WITH OR WITHOUT CONTRAST  TECHNIQUE: Angiographic images of the chest were obtained using MRA technique with intravenous contrast.  CONTRAST:  50mL MULTIHANCE GADOBENATE DIMEGLUMINE 529 MG/ML  IV SOLN  Creatinine was obtained on site at [facility] at [address]  Results: Creatinine 1.0 mg/dL.  Estimated GFR 76 mL/minute.  COMPARISON:  CTA of the chest on 06/28/2018 and additional MRA and CTA studies.  FINDINGS: VASCULAR  Aorta: The aortic root measures approximately 3.9-4.1 cm at the level of the sinuses of Valsalva. The ascending thoracic aorta is stable in caliber and measures 4.5 cm in greatest diameter. The proximal arch measures 3.6 cm and the distal arch 3.1 cm. The descending thoracic aorta measures 2.9 cm. There is no evidence of aortic dissection. Visualized proximal great vessels demonstrate normal patency and branching anatomy.  Heart: The heart size is within normal limits. No pericardial fluid is identified.  Pulmonary Arteries: Central pulmonary arteries are normal in caliber.  NON-VASCULAR  No evidence of incidental lymphadenopathy, masses or pleural effusions. Visualized bony structures are unremarkable.  IMPRESSION: Stable aneurysmal disease of the ascending thoracic aorta with maximal diameter of approximately 4.5 cm.   Electronically Signed   By: Aletta Edouard M.D.   On: 12/16/2018 12:56 I personally reviewed the MR images and concur with the findings noted above  Impression: Jye Fariss is a 68 year old man with a history of hypertension, hyperlipidemia, heart murmur, aortic atherosclerosis,  and an ascending aneurysm.  Aortic atherosclerosis/ascending aneurysm-relatively minimal change over time.  First noted in 2005 was 4.3 cm at that time.  Now 4.5 cm and stable from recent exam.  Needs continued semiannual follow-up.  Hypertension-blood pressure control reasonable.  Hyperlipidemia-managed by primary.  On lovastatin.  Plan: Return in 6 months with MR Angio of chest  Melrose Nakayama, MD [facility] [phone]

## 2018-12-20 ENCOUNTER — Other Ambulatory Visit: Payer: Self-pay

## 2018-12-20 ENCOUNTER — Ambulatory Visit (HOSPITAL_BASED_OUTPATIENT_CLINIC_OR_DEPARTMENT_OTHER): Payer: Medicare Other | Admitting: Internal Medicine

## 2018-12-20 ENCOUNTER — Encounter (HOSPITAL_BASED_OUTPATIENT_CLINIC_OR_DEPARTMENT_OTHER): Payer: Medicare Other

## 2019-01-01 ENCOUNTER — Other Ambulatory Visit: Payer: Self-pay

## 2019-01-01 ENCOUNTER — Ambulatory Visit (HOSPITAL_BASED_OUTPATIENT_CLINIC_OR_DEPARTMENT_OTHER): Payer: Medicare Other | Attending: Family Medicine | Admitting: Internal Medicine

## 2019-01-01 VITALS — Ht 73.0 in | Wt 200.0 lb

## 2019-01-01 DIAGNOSIS — G4733 Obstructive sleep apnea (adult) (pediatric): Secondary | ICD-10-CM | POA: Diagnosis not present

## 2019-01-01 DIAGNOSIS — R5383 Other fatigue: Secondary | ICD-10-CM

## 2019-01-07 DIAGNOSIS — G4733 Obstructive sleep apnea (adult) (pediatric): Secondary | ICD-10-CM | POA: Diagnosis not present

## 2019-01-07 NOTE — Procedures (Signed)
   Patient Name: Troy Reed, Troy Reed Date: 01/01/2019 Gender: Male D.O.B: Oct 15, 1950 Age (years): 28 Referring Provider: Derinda Late Height (inches): 52 Interpreting Physician: Baird Lyons MD, ABSM Weight (lbs): 200 RPSGT: Jacolyn Reedy BMI: 26 MRN: [id number] Neck Size: 16.00  CLINICAL INFORMATION Sleep Study Type: HST Indication for sleep study: Fatigue, OSA Epworth Sleepiness Score: 3  SLEEP STUDY TECHNIQUE A multi-channel overnight portable sleep study was performed. The channels recorded were: nasal airflow, thoracic respiratory movement, and oxygen saturation with a pulse oximetry. Snoring was also monitored.  MEDICATIONS Patient self administered medications include: none reported.  SLEEP ARCHITECTURE Patient was studied for 572.5 minutes. The sleep efficiency was 100.0 % and the patient was supine for 46.8%. The arousal index was 0.0 per hour.  RESPIRATORY PARAMETERS The overall AHI was 33.9 per hour, with a central apnea index of 0.0 per hour. The oxygen nadir was 83% during sleep.  CARDIAC DATA Mean heart rate during sleep was 68.0 bpm.  IMPRESSIONS - Severe obstructive sleep apnea occurred during this study (AHI = 33.9/h). - No significant central sleep apnea occurred during this study (CAI = 0.0/h). - Oxygen desaturation was noted during this study (Min O2 = 83%). Mean sat 93%. - Patient snored.  DIAGNOSIS - Obstructive Sleep Apnea (327.23 [G47.33 ICD-10])  RECOMMENDATIONS - Suggest CPAP titration sleep study or autopap. Other options would be based on clinical judgment. - Be careful with alcohol, sedatives and other CNS depressants that may worsen sleep apnea and disrupt normal sleep architecture. - Sleep hygiene should be reviewed to assess factors that may improve sleep quality. - Weight management and regular exercise should be initiated or continued.  [Electronically signed] 01/07/2019 09:28 AM  Baird Lyons MD, ABSM Diplomate, American  Board of Sleep Medicine   NPI: [id number]                          [place] [place]  ELECTRONICALLY SIGNED ON:  01/07/2019, 9:26 AM [place] PH: [phone]   FX: [phone] [place]

## 2019-01-27 ENCOUNTER — Other Ambulatory Visit: Payer: Self-pay

## 2019-01-27 ENCOUNTER — Encounter: Payer: Self-pay | Admitting: Pulmonary Disease

## 2019-01-27 ENCOUNTER — Ambulatory Visit (INDEPENDENT_AMBULATORY_CARE_PROVIDER_SITE_OTHER): Payer: Medicare Other | Admitting: Pulmonary Disease

## 2019-01-27 VITALS — BP 118/68 | HR 60 | Temp 97.2°F | Ht 73.0 in | Wt 223.2 lb

## 2019-01-27 DIAGNOSIS — Z7189 Other specified counseling: Secondary | ICD-10-CM | POA: Diagnosis not present

## 2019-01-27 DIAGNOSIS — G4733 Obstructive sleep apnea (adult) (pediatric): Secondary | ICD-10-CM

## 2019-01-27 NOTE — Patient Instructions (Signed)
Will arrange for CPAP set up Follow up in 2 months 

## 2019-01-27 NOTE — Progress Notes (Signed)
   Subjective:    Patient ID: Troy Reed, male    DOB: May 16, 1950, 68 y.o.   MRN: [id number]  HPI    Review of Systems  Respiratory: Positive for shortness of breath.   Psychiatric/Behavioral: Positive for sleep disturbance.       Objective:   Physical Exam        Assessment & Plan:

## 2019-01-27 NOTE — Progress Notes (Signed)
[facility]  Chief Complaint  Patient presents with  . Sleep Consult    Referred by Dr. Sandi Mariscal for sleep apnea. Patient reports that he does snore. He reports having a sleep study 01/01/2019.    Constitutional:  BP 118/68 (BP Location: Right Arm, Cuff Size: Normal)   Pulse 60   Temp (!) 97.2 F (36.2 C) (Temporal)   Ht 6\' 1"  (1.854 m)   Wt 223 lb 3.2 oz (101.2 kg)   SpO2 95%   BMI 29.45 kg/m   Past Medical History:  HTN, HLD, Colon polyp, Diverticulosis, DJD, TAA, ED, Asthma, Coronary calcifications  Brief Summary:  Troy Reed is a 68 y.o. male with obstructive sleep apnea.  He was travelling with his son recently.  Was told he snored and stopped breathing.  His wife has told him he snored for years.  He had home sleep study in August.  This showed severe sleep apnea.  He goes to sleep at 1130 pm.  He falls asleep in 10 minutes.  He wakes up 2 times to use the bathroom.  He gets out of bed at 7 am.  He feels tired in the morning sometimes.  He denies morning headache.  He does not use anything to help him fall sleep or stay awake.  He sometimes falls asleep while watching TV.  He has a mouthguard he uses for teeth grinding.  He denies sleep walking, sleep talking, or nightmares.  There is no history of restless legs.  He denies sleep hallucinations, sleep paralysis, or cataplexy.  The Epworth score is 4 out of 24.   Physical Exam:   Appearance - well kempt   ENMT - clear nasal mucosa, midline nasal  septum, no oral exudates, no LAN, trachea midline  Respiratory - normal chest wall, normal respiratory effort, no accessory muscle use, no wheeze/rales  CV - s1s2 regular rate and rhythm, no murmurs, no peripheral edema, radial pulses symmetric  GI - soft, non tender, no masses  Lymph - no adenopathy noted in neck and axillary areas  MSK - normal gait  Ext - no cyanosis, clubbing, or joint inflammation noted  Skin - no rashes,  lesions, or ulcers  Neuro - normal strength, oriented x 3  Psych - normal mood and affect   Assessment/Plan:   Snoring with excessive daytime sleepiness from obstructive sleep apnea. - various therapies for treatment were reviewed: CPAP, oral appliance, and surgical interventions - he is agreeable to try CPAP therapy - will arrange for auto CPAP set up  Obesity. - discussed how weight can impact sleep and risk for sleep disordered breathing - discussed options to assist with weight loss: combination of diet modification, cardiovascular and strength training exercises  Cardiovascular risk. - had an extensive discussion regarding the adverse health consequences related to untreated sleep disordered breathing - specifically discussed the risks for hypertension, coronary artery disease, cardiac dysrhythmias, cerebrovascular disease, and diabetes - lifestyle modification discussed  Safe driving practices. - discussed how sleep disruption can increase risk of accidents, particularly when driving - safe driving practices were discussed   Patient Instructions  Will arrange for CPAP set up  Follow up in 2 months    Troy Mires, MD [place] Pager: [phone] 01/27/2019, 9:46 AM  Flow Sheet     Pulmonary tests:  PFT 06/10/13 >> FEV1 3.29 (89%), FEV1% 72, TLC 7.67 (103%), DLCO 101%, +BD  Chest imaging:  CT angio chest 06/28/18 >> 4.4 cm  TAA  Sleep tests:  HST 12/20/18 >> AHI 33.9, SpO2 low 83%  Review of Systems:  Respiratory: Positive for shortness of breath.   Psychiatric/Behavioral: Positive for sleep disturbance.  Remainder reviewed and negative  Medications:   Allergies as of 01/27/2019      Reactions   Penicillins Hives   Septra [sulfamethoxazole-trimethoprim]       Medication List       Accurate as of January 27, 2019  9:46 AM. If you have any questions, ask your nurse or doctor.        ADVAIR DISKUS IN Inhale into the lungs.    albuterol 108 (90 Base) MCG/ACT inhaler Commonly known as: VENTOLIN HFA Inhale 2 puffs into the lungs every 6 (six) hours as needed.   losartan 100 MG tablet Commonly known as: COZAAR Take 100 mg by mouth.   lovastatin 40 MG tablet Commonly known as: MEVACOR Take 40 mg by mouth at bedtime.       Past Surgical History:  He  has a past surgical history that includes Inguinal hernia repair (Left); benign tumor removed; and Colonoscopy.  Family History:  His family history includes CAD (age of onset: 80) in his father; Cerebral aneurysm in his mother; Heart attack (age of onset: 62) in his brother.  Social History:  He  reports that he has never smoked. He has never used smokeless tobacco. He reports current alcohol use of about 2.0 standard drinks of alcohol per week. He reports that he does not use drugs.

## 2019-03-28 ENCOUNTER — Ambulatory Visit: Payer: Medicare Other | Admitting: Pulmonary Disease

## 2019-04-11 ENCOUNTER — Encounter: Payer: Self-pay | Admitting: Pulmonary Disease

## 2019-04-11 ENCOUNTER — Ambulatory Visit (INDEPENDENT_AMBULATORY_CARE_PROVIDER_SITE_OTHER): Payer: Medicare Other | Admitting: Pulmonary Disease

## 2019-04-11 ENCOUNTER — Other Ambulatory Visit: Payer: Self-pay

## 2019-04-11 VITALS — BP 122/64 | HR 60 | Temp 97.8°F | Ht 73.0 in | Wt 227.2 lb

## 2019-04-11 DIAGNOSIS — G4733 Obstructive sleep apnea (adult) (pediatric): Secondary | ICD-10-CM | POA: Diagnosis not present

## 2019-04-11 NOTE — Progress Notes (Signed)
[facility]  Chief Complaint  Patient presents with  . Follow-up    OSA on CPAP    Constitutional:  BP 122/64 (BP Location: Left Arm, Patient Position: Sitting, Cuff Size: Normal)   Pulse 60   Temp 97.8 F (36.6 C)   Ht 6\' 1"  (1.854 m)   Wt 227 lb 3.2 oz (103.1 kg)   SpO2 96% Comment: on room air  BMI 29.98 kg/m   Past Medical History:  HTN, HLD, Colon polyp, Diverticulosis, DJD, TAA, ED, Asthma, Coronary calcifications  Brief Summary:  Troy Reed is a 68 y.o. male with obstructive sleep apnea.  Since his last visit he was started on CPAP.  This has been working well.  Not having sinus congestion, sore throat, dry mouth or aerophagia.  Has full face mask.  Gets some irritation over the bridge of his nose.  Uses advair daily.  Not having cough, wheeze, sputum.  Hasn't had an episode of bronchitis this year likely he usually does.  Has been better about wearing a mask when he goes outside.   Physical Exam:   Appearance - well kempt   ENMT - no sinus tenderness, no nasal discharge, no oral exudate  Neck - no masses, trachea midline, no thyromegaly, no elevation in JVP  Respiratory - normal appearance of chest wall, normal respiratory effort w/o accessory muscle use, no dullness on percussion, no wheezing or rales  CV - s1s2 regular rate and rhythm, no murmurs, no peripheral edema, radial pulses symmetric  GI - soft, non tender  Lymph - no adenopathy noted in neck and axillary areas  MSK - normal gait  Ext - no cyanosis, clubbing, or joint inflammation noted  Skin - no rashes, lesions, or ulcers  Neuro - normal strength, oriented x 3  Psych - normal mood and affect    Assessment/Plan:   Obstructive sleep apnea. - he is compliant with therapy and reports benefit from CPAP - continue auto CPAP  Mild, persistent asthma. - discussed roles of his different inhalers - explained that advair is his maintenance inhaler -  he can use albuterol as needed; discussed indication for when he would need to use albuterol - he gets refills from his PCP   Patient Instructions  Follow up in 1 year    Chesley Mires, MD [place] Pager: [phone] 04/11/2019, 9:58 AM  Flow Sheet     Pulmonary tests:  PFT 06/10/13 >> FEV1 3.29 (89%), FEV1% 72, TLC 7.67 (103%), DLCO 101%, +BD  Chest imaging:  CT angio chest 06/28/18 >> 4.4 cm TAA  Sleep tests:  HST 12/20/18 >> AHI 33.9, SpO2 low 83% Auto CPAP 03/11/19 to 04/09/19 >> used on 24 of 30 nights with average 7 hrs 45 min.  Average AHI 4 with median CPAP 9 and 95 th percentile CPAP 12 cm H2O.  Medications:   Allergies as of 04/11/2019      Reactions   Penicillins Hives   Septra [sulfamethoxazole-trimethoprim]       Medication List       Accurate as of April 11, 2019  9:58 AM. If you have any questions, ask your nurse or doctor.        ADVAIR DISKUS IN Inhale into the lungs.   albuterol 108 (90 Base) MCG/ACT inhaler Commonly known as: VENTOLIN HFA Inhale 2 puffs into the lungs every 6 (six) hours as needed.   losartan 100 MG tablet Commonly known as: COZAAR Take 100 mg  by mouth.   lovastatin 40 MG tablet Commonly known as: MEVACOR Take 40 mg by mouth at bedtime.       Past Surgical History:  He  has a past surgical history that includes Inguinal hernia repair (Left); benign tumor removed; and Colonoscopy.  Family History:  His family history includes CAD (age of onset: 50) in his father; Cerebral aneurysm in his mother; Heart attack (age of onset: 45) in his brother.  Social History:  He  reports that he has never smoked. He has never used smokeless tobacco. He reports current alcohol use of about 2.0 standard drinks of alcohol per week. He reports that he does not use drugs.

## 2019-04-11 NOTE — Patient Instructions (Signed)
Follow up in 1 year.

## 2019-05-19 ENCOUNTER — Other Ambulatory Visit: Payer: Self-pay | Admitting: *Deleted

## 2019-05-19 DIAGNOSIS — I712 Thoracic aortic aneurysm, without rupture, unspecified: Secondary | ICD-10-CM

## 2019-06-22 ENCOUNTER — Ambulatory Visit: Payer: Medicare Other

## 2019-06-24 ENCOUNTER — Encounter: Payer: Medicare Other | Admitting: Thoracic Surgery (Cardiothoracic Vascular Surgery)

## 2019-06-28 ENCOUNTER — Ambulatory Visit
Admission: RE | Admit: 2019-06-28 | Discharge: 2019-06-28 | Disposition: A | Discharge status: Home / Self Care | Payer: Medicare Other | Source: Ambulatory Visit | Attending: Thoracic Surgery (Cardiothoracic Vascular Surgery) | Admitting: Thoracic Surgery (Cardiothoracic Vascular Surgery)

## 2019-06-28 DIAGNOSIS — I712 Thoracic aortic aneurysm, without rupture, unspecified: Secondary | ICD-10-CM

## 2019-06-28 MED ORDER — GADOBENATE DIMEGLUMINE 529 MG/ML IV SOLN
20.0000 mL | Freq: Once | INTRAVENOUS | Status: AC | PRN
  Administered 2019-06-28: 20 mL via INTRAVENOUS

## 2019-07-01 ENCOUNTER — Other Ambulatory Visit: Payer: Self-pay

## 2019-07-01 ENCOUNTER — Ambulatory Visit (INDEPENDENT_AMBULATORY_CARE_PROVIDER_SITE_OTHER): Payer: Medicare Other | Admitting: Thoracic Surgery (Cardiothoracic Vascular Surgery)

## 2019-07-01 VITALS — BP 145/81 | HR 72 | Temp 97.3°F | Resp 20 | Ht 73.0 in | Wt 222.0 lb

## 2019-07-01 DIAGNOSIS — I712 Thoracic aortic aneurysm, without rupture: Secondary | ICD-10-CM

## 2019-07-01 DIAGNOSIS — I10 Essential (primary) hypertension: Secondary | ICD-10-CM | POA: Diagnosis not present

## 2019-07-01 DIAGNOSIS — I700 Atherosclerosis of aorta: Secondary | ICD-10-CM

## 2019-07-01 DIAGNOSIS — I7121 Aneurysm of the ascending aorta, without rupture: Secondary | ICD-10-CM

## 2019-07-01 NOTE — Progress Notes (Signed)
[address]       [place] [address]             [phone]     HPI: Mr.Goswami returns for a scheduled follow-up visit regarding his ascending aortic aneurysm   Trayson Zenk is a 69 year old man with a history of hypertension, hyperlipidemia, heart murmur, thoracic aortic atherosclerosis, and ascending aortic aneurysm.  The aneurysm was first discovered in 2005.  It was 4.3 cm at that time.  He has been followed since.  I last saw him in August 2020 and the aneurysm was stable at 4.5 cm.  He has been feeling well.  He denies any chest pain, pressure, tightness, or shortness of breath.  He remains physically active.  He checks his blood pressure at home and says his systolic pressure is usually in the 125-135 range.  Past Medical History:  Diagnosis Date  . Ascending aortic aneurysm (HCC)    4.4 cm  . DDD (degenerative disc disease), cervical   . Diverticulosis   . History of colonic polyps   . Hyperlipidemia   . Hypertension     Current Outpatient Medications  Medication Sig Dispense Refill  . albuterol (VENTOLIN HFA) 108 (90 Base) MCG/ACT inhaler Inhale 2 puffs into the lungs every 6 (six) hours as needed.     . Fluticasone-Salmeterol (ADVAIR DISKUS IN) Inhale into the lungs.    Marland Kitchen losartan (COZAAR) 100 MG tablet Take 100 mg by mouth.    . lovastatin (MEVACOR) 40 MG tablet Take 40 mg by mouth at bedtime.      No current facility-administered medications for this visit.    Physical Exam BP (!) 145/81 (BP Location: Right Arm, Patient Position: Sitting, Cuff Size: Large)   Pulse 72   Temp (!) 97.3 F (36.3 C) (Oral)   Resp 20   Ht 6\' 1"  (1.854 m)   Wt 222 lb (100.7 kg)   SpO2 93% Comment: RA  BMI 29.63 kg/m  69 year old man in no acute distress Alert and oriented x3 with no focal deficits No carotid bruits Cardiac regular rate and rhythm with a 2/6 systolic murmur Lungs clear with equal breath sounds bilaterally No peripheral edema  Diagnostic  Tests: CT ANGIOGRAPHY CHEST WITH CONTRAST  TECHNIQUE: Multidetector CT imaging of the chest was performed using the standard protocol during bolus administration of intravenous contrast. Multiplanar CT image reconstructions and MIPs were obtained to evaluate the vascular anatomy.  CONTRAST:  10mL ISOVUE-370 IOPAMIDOL (ISOVUE-370) INJECTION 76%  COMPARISON:  CT scan of January 20, 2017.  FINDINGS: Cardiovascular: Grossly stable 4.4 cm ascending thoracic aortic aneurysm is noted. Atherosclerosis of thoracic aorta is noted without dissection. Great vessels are widely patent without significant stenosis. Normal cardiac size. No pericardial effusion is noted. Coronary calcifications are noted.  Mediastinum/Nodes: No enlarged mediastinal, hilar, or axillary lymph nodes. Thyroid gland, trachea, and esophagus demonstrate no significant findings.  Lungs/Pleura: Lungs are clear. No pleural effusion or pneumothorax.  Upper Abdomen: No acute abnormality.  Musculoskeletal: No chest wall abnormality. No acute or significant osseous findings.  Review of the MIP images confirms the above findings.  IMPRESSION: Grossly stable 4.4 cm ascending thoracic aortic aneurysm. Recommend annual imaging followup by CTA or MRA. This recommendation follows 2010 ACCF/AHA/AATS/ACR/ASA/SCA/SCAI/SIR/STS/SVM Guidelines for the Diagnosis and Management of Patients with Thoracic Aortic Disease. Circulation. 2010; [id number]. Aortic aneurysm NOS (ICD10-I71.9).  Coronary artery calcifications are noted.  Aortic Atherosclerosis (ICD10-I70.0).   Electronically Signed   By: Jeneen Rinks  Murlean Caller, M.D.   On: 06/28/2018 15:45 I personally reviewed the CT images and concur with the findings noted above  Impression: Mr. Mcmanigal is a 69 year old man with a history of hypertension, hyperlipidemia, heart murmur, thoracic aortic atherosclerosis and an ascending aortic aneurysm.  Ascending aortic  aneurysm/thoracic aortic atherosclerosis-stable.  Measured at 4.4 cm on Monday's scan versus 4.5 cm 6 months ago.  We will continue with semiannual follow-up.  Hypertension-his blood pressure was elevated today.  He has been checking it at home and says it was [id number] systolic this morning.  I advised him to keep close track of it at home and measure himself at different times during the day.  He is currently only on losartan might require dosage change or additional medications.  Hyperlipidemia-on Mevacor.  Managed by Dr. Sandi Mariscal Plan: Return in 6 months with CT angio of chest  Melrose Nakayama, MD [facility] [phone]

## 2019-11-27 ENCOUNTER — Other Ambulatory Visit: Payer: Self-pay | Admitting: Thoracic Surgery (Cardiothoracic Vascular Surgery)

## 2019-11-27 DIAGNOSIS — I712 Thoracic aortic aneurysm, without rupture, unspecified: Secondary | ICD-10-CM

## 2020-01-01 ENCOUNTER — Ambulatory Visit
Admission: RE | Admit: 2020-01-01 | Discharge: 2020-01-01 | Disposition: A | Discharge status: Home / Self Care | Payer: Medicare Other | Source: Ambulatory Visit | Attending: Thoracic Surgery (Cardiothoracic Vascular Surgery) | Admitting: Thoracic Surgery (Cardiothoracic Vascular Surgery)

## 2020-01-01 DIAGNOSIS — I712 Thoracic aortic aneurysm, without rupture, unspecified: Secondary | ICD-10-CM

## 2020-01-01 MED ORDER — IOPAMIDOL (ISOVUE-370) INJECTION 76%
75.0000 mL | Freq: Once | INTRAVENOUS | Status: AC | PRN
  Administered 2020-01-01: 75 mL via INTRAVENOUS

## 2020-01-06 ENCOUNTER — Other Ambulatory Visit: Payer: Self-pay

## 2020-01-06 ENCOUNTER — Ambulatory Visit (INDEPENDENT_AMBULATORY_CARE_PROVIDER_SITE_OTHER): Payer: Medicare Other | Admitting: Thoracic Surgery (Cardiothoracic Vascular Surgery)

## 2020-01-06 ENCOUNTER — Encounter: Payer: Self-pay | Admitting: Thoracic Surgery (Cardiothoracic Vascular Surgery)

## 2020-01-06 VITALS — BP 114/70 | HR 64 | Temp 99.1°F | Resp 16 | Ht 73.0 in | Wt 220.0 lb

## 2020-01-06 DIAGNOSIS — I712 Thoracic aortic aneurysm, without rupture: Secondary | ICD-10-CM | POA: Diagnosis not present

## 2020-01-06 DIAGNOSIS — I7121 Aneurysm of the ascending aorta, without rupture: Secondary | ICD-10-CM

## 2020-01-06 NOTE — Progress Notes (Signed)
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  He was first found to have an ascending aneurysm in 2005.  It was 4.3 cm.  He has been followed since then.  Most recently I saw him in February 2021 and his aneurysm was 4.5 cm.  He checks his blood pressure at home.  It remains stable and well within the normal range.  He is not having any chest pain, pressure, or tightness.  He denies shortness of breath.  Overall he feels well.  Past Medical History:  Diagnosis Date  . Ascending aortic aneurysm (HCC)    4.4 cm  . DDD (degenerative disc disease), cervical   . Diverticulosis   . History of colonic polyps   . Hyperlipidemia   . Hypertension      Current Outpatient Medications  Medication Sig Dispense Refill  . albuterol (VENTOLIN HFA) 108 (90 Base) MCG/ACT inhaler Inhale 2 puffs into the lungs every 6 (six) hours as needed.     . Fluticasone-Salmeterol (ADVAIR DISKUS IN) Inhale into the lungs.    Marland Kitchen losartan (COZAAR) 100 MG tablet Take 100 mg by mouth.    . lovastatin (MEVACOR) 40 MG tablet Take 20 mg by mouth at bedtime.      No current facility-administered medications for this visit.    Physical Exam BP 114/70 (BP Location: Right Arm, Patient Position: Sitting, Cuff Size: Large)   Pulse 64   Temp 99.1 F (37.3 C)   Resp 16   Ht 6\' 1"  (1.854 m)   Wt 220 lb (99.8 kg)   SpO2 95% Comment: RA  BMI 29.32 kg/m  69 year old man in no acute distress Well-developed and well-nourished Alert and oriented x3 with no focal deficits Lungs clear with equal breath sounds bilaterally Cardiac regular rate and rhythm with a normal S1 and S2, no murmur 1+ edema both lower extremities  Diagnostic  Tests: CT ANGIOGRAPHY CHEST WITH CONTRAST  TECHNIQUE: Multidetector CT imaging of the chest was performed using the standard protocol during bolus administration of intravenous contrast. Multiplanar CT image reconstructions and MIPs were obtained to evaluate the vascular anatomy.  CONTRAST:  50mL ISOVUE-370 IOPAMIDOL (ISOVUE-370) INJECTION 76%  COMPARISON:  Chest CTA 06/28/2018.  MRA chest 06/28/2019.  FINDINGS: Cardiovascular: Heart size is normal. There is no significant pericardial fluid, thickening or pericardial calcification. There is aortic atherosclerosis, as well as atherosclerosis of the great vessels of the mediastinum and the coronary arteries, including calcified atherosclerotic plaque in the left main, left anterior descending, left circumflex and right coronary arteries. Mild aneurysmal dilatation of the ascending thoracic aorta measuring 4.5 cm in diameter. Mid aortic Troy measures 3.2 cm in diameter. Descending thoracic aorta measures 2.8 cm in diameter.  Mediastinum/Nodes: No pathologically enlarged mediastinal or hilar lymph nodes. Esophagus is unremarkable in appearance. No axillary lymphadenopathy.  Lungs/Pleura: Nodular area of chronic pleuroparenchymal architectural distortion in the lower right lower lobe laterally (axial image 110 of series 6), similar to prior studies. No other suspicious appearing pulmonary nodules or masses are noted. No acute consolidative airspace disease. No pleural effusions.  Upper Abdomen: Unremarkable.  Musculoskeletal: Old healed fracture of the posterior aspect of  the right ninth rib. There are no aggressive appearing lytic or blastic lesions noted in the visualized portions of the skeleton.  Review of the MIP images confirms the above findings.  IMPRESSION: 1. Stable size of ascending thoracic aortic aneurysm which currently measures 4.5 cm in diameter. Ascending thoracic aortic aneurysm. Recommend  semi-annual imaging followup by CTA or MRA and referral to cardiothoracic surgery if not already obtained. This recommendation follows 2010 ACCF/AHA/AATS/ACR/ASA/SCA/SCAI/SIR/STS/SVM Guidelines for the Diagnosis and Management of Patients With Thoracic Aortic Disease. Circulation. 2010; 121: B403-J096. Aortic aneurysm NOS (ICD10-I71.9). 2. Aortic atherosclerosis, in addition to left main and 3 vessel coronary artery disease. 3. Additional incidental findings, similar prior studies, as above.  Aortic Atherosclerosis (ICD10-I70.0). Aortic aneurysm NOS (ICD10-I71.9).   Electronically Signed   By: Vinnie Langton M.D.   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  Ascending aneurysm/thoracic aortic atherosclerosis-aneurysm stable at 4.5 cm.  Minimal growth over many years.  Needs continued semiannual follow-up.  On Mevacor and Cozaar.  Hypertension-well controlled with Cozaar.  Coronary atherosclerosis noted on CT-no clinical symptoms.  On Mevacor.  Plan: Return in 6 months with MR angio of chest  Melrose Nakayama, MD [facility] [phone]

## 2020-05-26 ENCOUNTER — Other Ambulatory Visit: Payer: Self-pay | Admitting: Thoracic Surgery (Cardiothoracic Vascular Surgery)

## 2020-05-26 DIAGNOSIS — I712 Thoracic aortic aneurysm, without rupture, unspecified: Secondary | ICD-10-CM

## 2020-05-26 DIAGNOSIS — I7121 Aneurysm of the ascending aorta, without rupture: Secondary | ICD-10-CM

## 2020-06-07 ENCOUNTER — Other Ambulatory Visit: Payer: Self-pay | Admitting: *Deleted

## 2020-06-07 ENCOUNTER — Other Ambulatory Visit: Payer: Self-pay | Admitting: Thoracic Surgery (Cardiothoracic Vascular Surgery)

## 2020-06-07 ENCOUNTER — Telehealth: Payer: Self-pay | Admitting: *Deleted

## 2020-06-07 DIAGNOSIS — I7121 Aneurysm of the ascending aorta, without rupture: Secondary | ICD-10-CM

## 2020-06-07 DIAGNOSIS — I712 Thoracic aortic aneurysm, without rupture, unspecified: Secondary | ICD-10-CM

## 2020-06-07 NOTE — Telephone Encounter (Signed)
Pt called stating that as he has aged he has grown claustrophobic. Pt has upcoming MRA ordered to evaluate aortic aneurysm. Per Dr. Roxan Hockey, MRA changed to CT angio. [name] made aware.

## 2020-06-24 ENCOUNTER — Other Ambulatory Visit: Payer: Medicare Other

## 2020-06-29 ENCOUNTER — Ambulatory Visit
Admission: RE | Admit: 2020-06-29 | Discharge: 2020-06-29 | Disposition: A | Discharge status: Home / Self Care | Payer: Medicare Other | Source: Ambulatory Visit | Attending: Thoracic Surgery (Cardiothoracic Vascular Surgery) | Admitting: Thoracic Surgery (Cardiothoracic Vascular Surgery)

## 2020-06-29 ENCOUNTER — Other Ambulatory Visit: Payer: Self-pay

## 2020-06-29 ENCOUNTER — Ambulatory Visit: Payer: Medicare Other | Admitting: Thoracic Surgery (Cardiothoracic Vascular Surgery)

## 2020-06-29 ENCOUNTER — Ambulatory Visit (INDEPENDENT_AMBULATORY_CARE_PROVIDER_SITE_OTHER): Payer: Medicare Other | Admitting: Thoracic Surgery (Cardiothoracic Vascular Surgery)

## 2020-06-29 ENCOUNTER — Encounter: Payer: Self-pay | Admitting: Thoracic Surgery (Cardiothoracic Vascular Surgery)

## 2020-06-29 VITALS — BP 149/72 | HR 61 | Ht 73.0 in | Wt 248.0 lb

## 2020-06-29 DIAGNOSIS — I712 Thoracic aortic aneurysm, without rupture, unspecified: Secondary | ICD-10-CM

## 2020-06-29 MED ORDER — IOPAMIDOL (ISOVUE-370) INJECTION 76%
75.0000 mL | Freq: Once | INTRAVENOUS | Status: AC | PRN
  Administered 2020-06-29: 75 mL via INTRAVENOUS

## 2020-06-29 NOTE — Progress Notes (Signed)
[address]       [place] [address]             [phone]     HPI: 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 Was first found to have a 4.3 cm ascending aortic aneurysm in 2005. He has been followed since then.  I last saw him in August 2021. He was doing well at that time with the aneurysm measuring 4.5 cm.  In the interim since his last visit he has gained some weight. He has been relatively inactive. He has not been checking his blood pressure as frequently at home. He is not having any chest pain, pressure, tightness, or shortness of breath. No swelling in his legs.  Past Medical History:  Diagnosis Date  . Ascending aortic aneurysm (HCC)    4.4 cm  . DDD (degenerative disc disease), cervical   . Diverticulosis   . History of colonic polyps   . Hyperlipidemia   . Hypertension     Current Outpatient Medications  Medication Sig Dispense Refill  . albuterol (VENTOLIN HFA) 108 (90 Base) MCG/ACT inhaler Inhale 2 puffs into the lungs every 6 (six) hours as needed.     . Fluticasone-Salmeterol (ADVAIR DISKUS IN) Inhale into the lungs.    Marland Kitchen losartan (COZAAR) 100 MG tablet Take 100 mg by mouth.    . lovastatin (MEVACOR) 40 MG tablet Take 20 mg by mouth at bedtime.      No current facility-administered medications for this visit.    Physical Exam BP (!) 149/72 (BP Location: Right Arm, Patient Position: Sitting)   Pulse 61   Ht 6\' 1"  (1.854 m)   Wt 248 lb (112.5 kg)   SpO2 97% Comment: RA  BMI 32.31 kg/m  70 year old man in no acute distress, significant weight gain since last visit Alert and oriented x3 with no focal deficits Lungs clear bilaterally Cardiac regular rate and rhythm with no rubs or murmurs No peripheral  edema  Diagnostic Tests: CT ANGIOGRAPHY CHEST WITH CONTRAST  TECHNIQUE: Multidetector CT imaging of the chest was performed using the standard protocol during bolus administration of intravenous contrast. Multiplanar CT image reconstructions and MIPs were obtained to evaluate the vascular anatomy.  CONTRAST:  27mL ISOVUE-370 IOPAMIDOL (ISOVUE-370) INJECTION 76%  Creatinine was obtained on site at [facility] at [address]  Results: Creatinine 1 mg/dL.  COMPARISON:  01/01/2020  FINDINGS: Cardiovascular: Preferential opacification of the thoracic aorta. Stable mild aneurysmal dilatation of the ascending thoracic aorta measuring 4.4 cm. Mild aortic atherosclerosis. Normal heart size. Coronary artery calcification. No pericardial effusion.  Mediastinum/Nodes: No enlarged lymph nodes. Thyroid and esophagus are unremarkable.  Lungs/Pleura: Mild scarring and atelectasis at the lung bases. No new consolidation or mass. No pleural effusion.  Upper Abdomen: No acute abnormality.  Musculoskeletal: No acute osseous abnormality.  Review of the MIP images confirms the above findings.  IMPRESSION: Stable mild aneurysmal dilatation of the ascending thoracic aorta. Recommend annual imaging followup by CTA or MRA.  No new findings.   Electronically Signed   By: Macy Mis M.D.   On: 06/29/2020 15:09 I personally reviewed the CT chest. My measurements of the ascending aorta was 4.5 cm. 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   Ascending thoracic aneurysm/thoracic aortic atherosclerosis-stable at 4.5 cm. Importance of blood pressure control was emphasized. Needs continued semiannual  follow-up.  Hypertension-blood pressure elevated 110 systolic. He has been well controlled previously. He also was previously very good about monitoring his blood pressure at home. I encouraged him to do that on a regular basis. No medication changes were made today but if his blood pressure remains elevated at home he should contact us.  Parenchymal scarring/atelectasis lung bases-stable dating back to 2010. We will continue to monitor on scans as we follow his aorta.  Hyperlipidemia-on Mevacor.  Plan: Return in 6 months with CT angio of chest  Melrose Nakayama, MD [facility] [phone]

## 2020-11-10 ENCOUNTER — Other Ambulatory Visit: Payer: Self-pay | Admitting: Thoracic Surgery (Cardiothoracic Vascular Surgery)

## 2020-11-10 DIAGNOSIS — I712 Thoracic aortic aneurysm, without rupture, unspecified: Secondary | ICD-10-CM

## 2020-12-30 ENCOUNTER — Ambulatory Visit
Admission: RE | Admit: 2020-12-30 | Discharge: 2020-12-30 | Disposition: A | Discharge status: Home / Self Care | Payer: Medicare Other | Source: Ambulatory Visit | Attending: Thoracic Surgery (Cardiothoracic Vascular Surgery) | Admitting: Thoracic Surgery (Cardiothoracic Vascular Surgery)

## 2020-12-30 DIAGNOSIS — I712 Thoracic aortic aneurysm, without rupture, unspecified: Secondary | ICD-10-CM

## 2020-12-30 MED ORDER — IOPAMIDOL (ISOVUE-370) INJECTION 76%
75.0000 mL | Freq: Once | INTRAVENOUS | Status: AC | PRN
  Administered 2020-12-30: 75 mL via INTRAVENOUS

## 2021-01-04 ENCOUNTER — Other Ambulatory Visit: Payer: Self-pay

## 2021-01-04 ENCOUNTER — Ambulatory Visit (INDEPENDENT_AMBULATORY_CARE_PROVIDER_SITE_OTHER): Payer: Medicare Other | Admitting: Thoracic Surgery (Cardiothoracic Vascular Surgery)

## 2021-01-04 DIAGNOSIS — I712 Thoracic aortic aneurysm, without rupture: Secondary | ICD-10-CM

## 2021-01-04 DIAGNOSIS — I7121 Aneurysm of the ascending aorta, without rupture: Secondary | ICD-10-CM

## 2021-01-04 NOTE — Progress Notes (Signed)
[address]       [place] [address]             [phone]      HPI: Mr.Metsker presents for follow-up of an ascending thoracic aortic aneurysm.  This visit is being conducted by telephone at his request as he was diagnosed with COVID-19 yesterday.  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  Mr. Koob was first noted to have an ascending aneurysm in 2005.  Was 4.3 centimeters at that time.  He has been followed since then.  I last saw him in February 2022.  The aneurysm was stable at 4.5 cm.  There also was a stable parenchymal opacity in the right lower lobe adjacent to rib fractures felt to represent rounded atelectasis versus scarring.  He was feeling well until the last few days and then yesterday was diagnosed with COVID-19.  He has been started on treatment for that.   Current Outpatient Medications  Medication Sig Dispense Refill   albuterol (VENTOLIN HFA) 108 (90 Base) MCG/ACT inhaler Inhale 2 puffs into the lungs every 6 (six) hours as needed.      Fluticasone-Salmeterol (ADVAIR DISKUS IN) Inhale into the lungs.     losartan (COZAAR) 100 MG tablet Take 100 mg by mouth.     lovastatin (MEVACOR) 40 MG tablet Take 20 mg by mouth at bedtime.      No current facility-administered medications for this visit.    Physical Exam There were no vitals taken for this visit. No exam due to telephone visit   Diagnostic Tests: CT ANGIOGRAPHY CHEST WITH CONTRAST   TECHNIQUE: Multidetector CT imaging of the chest was performed using the standard protocol during bolus administration of intravenous contrast. Multiplanar CT image reconstructions and MIPs were obtained to evaluate the vascular anatomy.   CONTRAST:  61m  ISOVUE-370 IOPAMIDOL (ISOVUE-370) INJECTION 76%   [place] [facility] [place] [address]   Results: Creatinine 1.0 mg/dL.   COMPARISON:  Chest CT-06/29/2020; 01/01/2020; 06/28/2018; 10/13/2009; 11/24/2004; coronary CT-06/12/2014; chest MRI-07/03/2019; 12/16/2018   FINDINGS: Vascular Findings:   Stable mild fusiform aneurysmal dilatation of the ascending thoracic aorta with measurements as follows. The thoracic aorta tapers to a normal caliber at the level of the aortic arch. No evidence of thoracic aortic dissection or periaortic stranding this nongated examination.   There is a minimal amount of atherosclerotic plaque involving the aortic arch and descending thoracic aorta, not resulting in a hemodynamically significant stenosis. Conventional configuration of the aortic arch. The branch vessels of the aortic arch appear patent throughout their imaged courses. The descending thoracic aorta is of normal caliber.   Normal heart size. No pericardial effusion. Coronary artery calcifications. Although this examination was not tailored for the evaluation the pulmonary arteries, there are no discrete filling defects within the central pulmonary arterial tree to suggest central pulmonary embolism.   -------------------------------------------------------------   Thoracic aortic measurements:   SINOTUBULAR JUNCTION: 35 mm as measured in greatest oblique short axis coronal dimension.   PROXIMAL ASCENDING THORACIC AORTA: 44 mm as measured in greatest oblique short axis axial dimension at the level of the main pulmonary artery (axial image 84, series 5) and approximately 44 mm in greatest oblique short axis coronal diameter (coronal image 89, series 10),  unchanged compared to the 2006 examination   AORTIC ARCH: 35 mm as measured in greatest oblique short axis sagittal dimension.   PROXIMAL DESCENDING THORACIC AORTA: 31 mm as measured in  greatest oblique short axis axial dimension at the level of the main pulmonary artery.   DISTAL DESCENDING THORACIC AORTA: 29 mm as measured in greatest oblique short axis axial dimension at the level of the diaphragmatic hiatus.   Review of the MIP images confirms the above findings.   -------------------------------------------------------------   Non-Vascular Findings:   Mediastinum/Lymph Nodes: No bulky mediastinal, hilar or axillary lymphadenopathy.   Lungs/Pleura: Redemonstrated approximately 1.8 x 0.9 cm subpleural nodular opacity within the right costophrenic angle subjacent to adjacent rib fractures, similar to the 06/2018 examination and favored to represent an area of rounded atelectasis. Minimal dependent subpleural ground-glass atelectasis. No discrete focal airspace opacities. No pleural effusion or pneumothorax. No air bronchograms.   Upper abdomen: Limited early arterial phase evaluation of the upper abdomen demonstrates diffuse decreased attenuation hepatic parenchyma suggestive of hepatic steatosis. Atherosclerotic plaque when the imaged portions of the abdominal aorta.   Musculoskeletal: No acute or aggressive osseous abnormalities. Old eighth and ninth posterolateral rib fractures with residual deformity. 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 Multilevel DDD of the imaged lower cervical spine is suspected. Regional soft tissues appear normal. Normal appearance of the thyroid gland.   IMPRESSION: 1. Stable uncomplicated mild fusiform aneurysmal dilatation of the ascending thoracic aorta measuring 44 mm in diameter, unchanged since the 2006 examination. Aortic aneurysm NOS (ICD10-I71.9). 2. Coronary calcifications.  Aortic Atherosclerosis (ICD10-I70.0). 3. Grossly unchanged scarring/rounded atelectasis at the level of the right costophrenic angle, subjacent to old/healed right eighth and ninth rib fractures.      Electronically Signed   By: Sandi Mariscal M.D.   On: 12/30/2020 14:49 I personally reviewed the CT images.  There is a stable 4.5 centimeter fusiform ascending thoracic aortic aneurysm in the setting of aortic atherosclerosis.  Coronary atherosclerosis also noted.  No change in the opacity in the right lower lobe adjacent to rib fractures.  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  COVID-19 infection-just diagnosed yesterday.  Being treated by Dr. Sandi Mariscal.  Thoracic aortic atherosclerosis/ascending aortic aneurysm-stable at 4.5 cm.  He is aware of the importance of blood pressure control.  We will repeat CT in 6 months  Opacity right lower lobe-adjacent rib fractures.  Likely rounded atelectasis versus scarring.  Remained stable.  We will continue to follow as we follow his aneurysm.  Plan: Return in 6 months with CT angiogram of chest   I spent 10 minutes in review of records, images, and in consultation with Mr. Quigley via telephone today.  Melrose Nakayama, MD [facility] [phone]

## 2021-01-14 ENCOUNTER — Other Ambulatory Visit: Payer: Self-pay | Admitting: Family Medicine

## 2021-01-14 DIAGNOSIS — M25552 Pain in left hip: Secondary | ICD-10-CM

## 2021-01-14 DIAGNOSIS — M25551 Pain in right hip: Secondary | ICD-10-CM

## 2021-01-30 ENCOUNTER — Other Ambulatory Visit: Payer: Self-pay

## 2021-01-30 ENCOUNTER — Ambulatory Visit
Admission: RE | Admit: 2021-01-30 | Discharge: 2021-01-30 | Disposition: A | Discharge status: Home / Self Care | Payer: Medicare Other | Source: Ambulatory Visit | Attending: Family Medicine | Admitting: Family Medicine

## 2021-01-30 DIAGNOSIS — M25551 Pain in right hip: Secondary | ICD-10-CM

## 2021-01-30 DIAGNOSIS — M25552 Pain in left hip: Secondary | ICD-10-CM

## 2021-03-02 ENCOUNTER — Other Ambulatory Visit: Payer: Self-pay | Admitting: Family Medicine

## 2021-03-02 DIAGNOSIS — M545 Low back pain, unspecified: Secondary | ICD-10-CM

## 2021-03-11 ENCOUNTER — Ambulatory Visit
Admission: RE | Admit: 2021-03-11 | Discharge: 2021-03-11 | Disposition: A | Discharge status: Home / Self Care | Payer: Medicare Other | Source: Ambulatory Visit | Attending: Family Medicine | Admitting: Family Medicine

## 2021-03-11 DIAGNOSIS — M545 Low back pain, unspecified: Secondary | ICD-10-CM

## 2021-05-11 ENCOUNTER — Other Ambulatory Visit: Payer: Self-pay | Admitting: *Deleted

## 2021-05-11 DIAGNOSIS — I7121 Aneurysm of the ascending aorta, without rupture: Secondary | ICD-10-CM

## 2021-05-11 DIAGNOSIS — R918 Other nonspecific abnormal finding of lung field: Secondary | ICD-10-CM

## 2021-06-30 ENCOUNTER — Inpatient Hospital Stay: Admission: RE | Admit: 2021-06-30 | Payer: Medicare Other | Source: Ambulatory Visit

## 2021-07-05 ENCOUNTER — Encounter: Payer: Medicare Other | Admitting: Thoracic Surgery (Cardiothoracic Vascular Surgery)

## 2021-07-14 ENCOUNTER — Ambulatory Visit
Admission: RE | Admit: 2021-07-14 | Discharge: 2021-07-14 | Disposition: A | Discharge status: Home / Self Care | Payer: Medicare Other | Source: Ambulatory Visit | Attending: Thoracic Surgery (Cardiothoracic Vascular Surgery) | Admitting: Thoracic Surgery (Cardiothoracic Vascular Surgery)

## 2021-07-14 ENCOUNTER — Other Ambulatory Visit: Payer: Self-pay

## 2021-07-14 ENCOUNTER — Inpatient Hospital Stay: Admission: RE | Admit: 2021-07-14 | Payer: Medicare Other | Source: Ambulatory Visit

## 2021-07-14 DIAGNOSIS — I7121 Aneurysm of the ascending aorta, without rupture: Secondary | ICD-10-CM

## 2021-07-14 DIAGNOSIS — R918 Other nonspecific abnormal finding of lung field: Secondary | ICD-10-CM

## 2021-07-14 MED ORDER — IOPAMIDOL (ISOVUE-370) INJECTION 76%
75.0000 mL | Freq: Once | INTRAVENOUS | Status: AC | PRN
  Administered 2021-07-14: 75 mL via INTRAVENOUS

## 2021-07-26 ENCOUNTER — Ambulatory Visit (INDEPENDENT_AMBULATORY_CARE_PROVIDER_SITE_OTHER): Payer: Medicare Other | Admitting: Thoracic Surgery (Cardiothoracic Vascular Surgery)

## 2021-07-26 ENCOUNTER — Other Ambulatory Visit: Payer: Self-pay

## 2021-07-26 ENCOUNTER — Encounter: Payer: Self-pay | Admitting: Thoracic Surgery (Cardiothoracic Vascular Surgery)

## 2021-07-26 VITALS — BP 115/76 | HR 84 | Resp 20 | Wt 225.0 lb

## 2021-07-26 DIAGNOSIS — I7121 Aneurysm of the ascending aorta, without rupture: Secondary | ICD-10-CM

## 2021-07-26 NOTE — Progress Notes (Signed)
? ?   ?[address] ?      [place] [address] ?            [phone]   ? ? ?HPI: [name] returns for a scheduled follow-up visit regarding his ascending aneurysm. ? ?Troy Reed is a 71 year old man with a history of an ascending aortic aneurysm, thoracic aortic atherosclerosis, hypertension, hyperlipidemia, degenerative disc disease, rib fractures and diverticulosis.  He was first noted to have a 4.3 cm ascending aneurysm in 2005. ? ?I last saw him in August 2022.  The aneurysm was stable.  There is an opacity in the right lower lobe adjacent to a healed rib fracture that has been present since 2020 and was stable as well. ? ?In the interim since his last visit he has been feeling well.  He says he has gained some weight.  He is not having any chest pain, pressure, or tightness.  No new health issues. ? ?Past Medical History:  ?Diagnosis Date  ? Ascending aortic aneurysm   ? 4.4 cm  ? DDD (degenerative disc disease), cervical   ? Diverticulosis   ? History of colonic polyps   ? Hyperlipidemia   ? Hypertension   ? ? ? ?Current Outpatient Medications  ?Medication Sig Dispense Refill  ? albuterol (VENTOLIN HFA) 108 (90 Base) MCG/ACT inhaler Inhale 2 puffs into the lungs every 6 (six) hours as needed.     ? Fluticasone-Salmeterol (ADVAIR DISKUS IN) Inhale into the lungs.    ? losartan (COZAAR) 100 MG tablet Take 100 mg by mouth.    ? lovastatin (MEVACOR) 40 MG tablet Take 20 mg by mouth at bedtime.     ? ?No current facility-administered medications for this visit.  ? ? ?Physical Exam ?BP 115/76 (BP Location: Right Arm, Patient Position: Sitting)   Pulse 84   Resp 20   Wt 225 lb (102.1 kg)   SpO2 95% Comment: RA  BMI 29.69 kg/m?  ?Well-appearing 71 year old man in no acute distress ?Alert and oriented x3 with no focal deficits ?Lungs clear bilaterally ?Cardiac regular rate and rhythm normal S1 and S2, no murmur ?No carotid bruits ? ?Diagnostic Tests: ?CT ANGIOGRAPHY CHEST WITH CONTRAST ?   ?TECHNIQUE: ?Multidetector CT imaging of the chest was performed using the ?standard protocol during bolus administration of intravenous ?contrast. Multiplanar CT image reconstructions and MIPs were ?obtained to evaluate the vascular anatomy. ?  ?CONTRAST:  78m ISOVUE-370 IOPAMIDOL (ISOVUE-370) INJECTION 76% ?  ?[facility] [address] [facility] [employer] [facility] ?  ?Results: Creatinine 1.0 mg/dL. ?  ?COMPARISON:  Chest CT-06/29/2020; 01/01/2020; 06/28/2018; ?10/13/2009; 11/24/2004; coronary CT-06/12/2014; chest ?MRI-07/03/2019; 12/16/2018 ?  ?FINDINGS: ?Vascular Findings: ?  ?Stable mild fusiform aneurysmal dilatation of the ascending thoracic ?aorta with measurements as follows. The thoracic aorta tapers to a ?normal caliber at the level of the aortic arch. No evidence of ?thoracic aortic dissection or periaortic stranding this nongated ?examination. ?  ?There is a minimal amount of atherosclerotic plaque involving the ?aortic arch and descending thoracic aorta, not resulting in a ?hemodynamically significant stenosis. Conventional configuration of ?the aortic arch. The branch vessels of the aortic arch appear patent ?throughout their imaged courses. The descending thoracic aorta is of ?normal caliber. ?  ?Normal heart size. No pericardial effusion. Coronary artery ?calcifications. Although this examination was not tailored for the ?evaluation the pulmonary arteries, there are no discrete filling ?defects within the central pulmonary arterial tree to suggest ?central pulmonary embolism. ?  ?------------------------------------------------------------- ?  ?  Thoracic aortic measurements: ?  ?SINOTUBULAR JUNCTION: 35 mm as measured in greatest oblique short ?axis coronal dimension. ?  ?PROXIMAL ASCENDING THORACIC AORTA: 44 mm as measured in greatest ?oblique short axis axial dimension at the level of the main ?pulmonary artery (axial image 84, series 5) and approximately 44 mm ?in greatest  oblique short axis coronal diameter (coronal image 89, ?series 10), unchanged compared to the 2006 examination ?  ?AORTIC ARCH: 35 mm as measured in greatest oblique short axis ?sagittal dimension. ?  ?PROXIMAL DESCENDING THORACIC AORTA: 31 mm as measured in greatest ?oblique short axis axial dimension at the level of the main ?pulmonary artery. ?  ?DISTAL DESCENDING THORACIC AORTA: 29 mm as measured in greatest ?oblique short axis axial dimension at the level of the diaphragmatic ?hiatus. ?  ?Review of the MIP images confirms the above findings. ?  ?------------------------------------------------------------- ?  ?Non-Vascular Findings: ?  ?Mediastinum/Lymph Nodes: No bulky mediastinal, hilar or axillary ?lymphadenopathy. ?  ?Lungs/Pleura: Redemonstrated approximately 1.8 x 0.9 cm subpleural ?nodular opacity within the right costophrenic angle subjacent to ?adjacent rib fractures, similar to the 06/2018 examination and ?favored to represent an area of rounded atelectasis. Minimal ?dependent subpleural ground-glass atelectasis. No discrete focal ?airspace opacities. No pleural effusion or pneumothorax. No air ?bronchograms. ?  ?Upper abdomen: Limited early arterial phase evaluation of the upper ?abdomen demonstrates diffuse decreased attenuation hepatic ?parenchyma suggestive of hepatic steatosis. Atherosclerotic plaque ?when the imaged portions of the abdominal aorta. ?  ?Musculoskeletal: No acute or aggressive osseous abnormalities. Old ?eighth and ninth posterolateral rib fractures with residual ?deformity. 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 Multilevel DDD of the imaged lower cervical spine is ?suspected. Regional soft tissues appear normal. Normal appearance of ?the thyroid gland. ?  ?IMPRESSION: ?1. Stable uncomplicated mild fusiform aneurysmal dilatation of the ?ascending thoracic aorta measuring 44 mm in diameter, unchanged ?since the 2006 examination. Aortic aneurysm NOS  (ICD10-I71.9). ?2. Coronary calcifications.  Aortic Atherosclerosis (ICD10-I70.0). ?3. Grossly unchanged scarring/rounded atelectasis at the level of ?the right costophrenic angle, subjacent to old/healed right eighth ?and ninth rib fractures. ?  ?  ?Electronically Signed ?  By: Sandi Mariscal M.D. ?  On: 12/30/2020 14:49 ?I personally reviewed the CT images.  There is been no change in the ascending aneurysm measuring about 4.4 to 4.5 cm.  Aortic and coronary atherosclerosis.  No change in lung opacity adjacent to rib fracture. ? ?Impression: ?Troy Reed is a 71 year old man with a history of an ascending aortic aneurysm, thoracic aortic atherosclerosis, hypertension, hyperlipidemia, degenerative disc disease, rib fractures, and diverticulosis. ? ?Ascending thoracic aneurysm/thoracic aortic atherosclerosis-stable measuring around 4.4 to 4.5 cm.  Has been stable dating back to 2005.  I think we can safely go to an annual follow-up visit unless we see further growth. ? ?Hypertension-blood pressure well controlled with Cozaar. ? ?Rib fractures/opacity right lower lobe-stable over time.  Likely scarring from injury from adjacent rib fracture versus round atelectasis.  Lifelong non-smoker.  We will recheck in a year. ? ?Plan: ?Return in 1 year with CT angiogram of chest ? ?Melrose Nakayama, MD ?[facility] ?[phone] ? ? ? ?

## 2021-11-09 ENCOUNTER — Ambulatory Visit
Admission: RE | Admit: 2021-11-09 | Discharge: 2021-11-09 | Disposition: A | Discharge status: Home / Self Care | Payer: Medicare Other | Source: Ambulatory Visit | Attending: Family Medicine | Admitting: Family Medicine

## 2021-11-09 ENCOUNTER — Other Ambulatory Visit: Payer: Self-pay | Admitting: Family Medicine

## 2021-11-09 DIAGNOSIS — T1490XA Injury, unspecified, initial encounter: Secondary | ICD-10-CM

## 2022-03-10 ENCOUNTER — Encounter: Payer: Self-pay | Admitting: Internal Medicine

## 2022-04-13 ENCOUNTER — Ambulatory Visit (AMBULATORY_SURGERY_CENTER): Payer: Medicare Other

## 2022-04-13 VITALS — Ht 73.0 in | Wt 240.0 lb

## 2022-04-13 DIAGNOSIS — Z86010 Personal history of colonic polyps: Secondary | ICD-10-CM

## 2022-04-13 NOTE — Progress Notes (Signed)

## 2022-05-16 ENCOUNTER — Encounter: Payer: Self-pay | Admitting: Internal Medicine

## 2022-05-16 ENCOUNTER — Ambulatory Visit (AMBULATORY_SURGERY_CENTER): Payer: Medicare Other | Admitting: Internal Medicine

## 2022-05-16 VITALS — BP 136/68 | HR 58 | Temp 97.7°F | Resp 13 | Ht 73.0 in | Wt 240.0 lb

## 2022-05-16 DIAGNOSIS — Z09 Encounter for follow-up examination after completed treatment for conditions other than malignant neoplasm: Secondary | ICD-10-CM

## 2022-05-16 DIAGNOSIS — Z86010 Personal history of colonic polyps: Secondary | ICD-10-CM

## 2022-05-16 DIAGNOSIS — D123 Benign neoplasm of transverse colon: Secondary | ICD-10-CM | POA: Diagnosis not present

## 2022-05-16 DIAGNOSIS — D122 Benign neoplasm of ascending colon: Secondary | ICD-10-CM | POA: Diagnosis not present

## 2022-05-16 MED ORDER — SODIUM CHLORIDE 0.9 % IV SOLN: 500.0000 mL | Freq: Once | INTRAVENOUS | Status: AC

## 2022-05-16 NOTE — Op Note (Signed)
[facility] Patient Name: Troy Reed Procedure Date: 05/16/2022 10:02 AM MRN: [id number] Endoscopist: Gatha Mayer , MD, [id number] Age: 72 Referring MD:  Date of Birth: 04/05/1951 Gender: Male Account #: [id number] Procedure:                Colonoscopy Indications:              Surveillance: Personal history of adenomatous                            polyps on last colonoscopy 5 years ago, Last                            colonoscopy: 2018 Medicines:                Monitored Anesthesia Care Procedure:                Pre-Anesthesia Assessment:                           - Prior to the procedure, a History and Physical                            was performed, and patient medications and                            allergies were reviewed. The patient's tolerance of                            previous anesthesia was also reviewed. The risks                            and benefits of the procedure and the sedation                            options and risks were discussed with the patient.                            All questions were answered, and informed consent                            was obtained. Prior Anticoagulants: The patient has                            taken no anticoagulant or antiplatelet agents. ASA                            Grade Assessment: II - A patient with mild systemic                            disease. After reviewing the risks and benefits,                            the patient was deemed in satisfactory condition to  undergo the procedure.                           After obtaining informed consent, the colonoscope                            was passed under direct vision. Throughout the                            procedure, the patient's blood pressure, pulse, and                            oxygen saturations were monitored continuously. The                            Olympus PCF-[id number] (#[id number]) Colonoscope was                             introduced through the anus and advanced to the the                            cecum, identified by appendiceal orifice and                            ileocecal valve. The colonoscopy was somewhat                            difficult due to significant looping. Successful                            completion of the procedure was aided by applying                            abdominal pressure. The patient tolerated the                            procedure well. The quality of the bowel                            preparation was adequate. The ileocecal valve,                            appendiceal orifice, and rectum were photographed. Scope In: 10:21:42 AM Scope Out: 10:40:36 AM Scope Withdrawal Time: 0 hours 14 minutes 26 seconds  Total Procedure Duration: 0 hours 18 minutes 54 seconds  Findings:                 The perianal and digital rectal examinations were                            normal. Pertinent negatives include normal prostate                            (size, shape, and consistency).  Two sessile polyps were found in the transverse                            colon and ascending colon. The polyps were                            diminutive in size. These polyps were removed with                            a cold snare. Resection and retrieval were                            complete. Verification of patient identification                            for the specimen was done. Estimated blood loss was                            minimal.                           Multiple diverticula were found in the entire colon.                           Internal hemorrhoids were found.                           The exam was otherwise without abnormality on                            direct and retroflexion views. Complications:            No immediate complications. Estimated Blood Loss:     Estimated blood loss was minimal. Impression:                - Two diminutive polyps in the transverse colon and                            in the ascending colon, removed with a cold snare.                            Resected and retrieved.                           - Diverticulosis in the entire examined colon.                           - Internal hemorrhoids.                           - The examination was otherwise normal on direct                            and retroflexion views.                           -  Personal history of colonic polyps. 25 mm villous                            adenoma 2005                           4 mm tubular adenoma 2008                           No polyps 10/09/2011                           03/06/2017 diminutive rectal polyp hyperplastic Recommendation:           - Patient has a contact number available for                            emergencies. The signs and symptoms of potential                            delayed complications were discussed with the                            patient. Return to normal activities tomorrow.                            Written discharge instructions were provided to the                            patient.                           - Resume previous diet.                           - Continue present medications.                           - Await pathology results.                           - No recommendation at this time regarding repeat                            colonoscopy due to age. Gatha Mayer, MD 05/16/2022 10:54:13 AM This report has been signed electronically.

## 2022-05-16 NOTE — Progress Notes (Signed)
[facility] History and Physical   Primary Care Physician:  Derinda Late, MD   Reason for Procedure:   Hx colon polyps  Plan:    colonoscopy     HPI: Troy Reed is a 72 y.o. male s/p removal of polyps in past as below  25 mm villous adenoma 2005 4 mm tubular adenoma 2008 No polyps 10/09/2011  03/06/2017 diminutive rectal polyp hyperplastic  Past Medical History:  Diagnosis Date   Ascending aortic aneurysm (HCC)    4.4 cm   DDD (degenerative disc disease), cervical    Diverticulosis    History of colonic polyps    Hyperlipidemia    Hypertension    Sleep apnea     Past Surgical History:  Procedure Laterality Date   benign tumor removed     knee   COLONOSCOPY     INGUINAL HERNIA REPAIR Left     Prior to Admission medications   Medication Sig Start Date End Date Taking? Authorizing Provider  albuterol (VENTOLIN HFA) 108 (90 Base) MCG/ACT inhaler Inhale 2 puffs into the lungs every 6 (six) hours as needed.  05/13/14  Yes [provider]  hydrochlorothiazide (HYDRODIURIL) 25 MG tablet Take 25 mg by mouth every morning. 02/16/22  Yes [provider]  losartan (COZAAR) 100 MG tablet Take 100 mg by mouth. 06/24/15  Yes [provider]  lovastatin (MEVACOR) 40 MG tablet Take 20 mg by mouth at bedtime.  07/07/11  Yes [provider]  Fluticasone-Salmeterol (ADVAIR DISKUS IN) Inhale into the lungs.    [provider]    Current Outpatient Medications  Medication Sig Dispense Refill   albuterol (VENTOLIN HFA) 108 (90 Base) MCG/ACT inhaler Inhale 2 puffs into the lungs every 6 (six) hours as needed.      hydrochlorothiazide (HYDRODIURIL) 25 MG tablet Take 25 mg by mouth every morning.     losartan (COZAAR) 100 MG tablet Take 100 mg by mouth.     lovastatin (MEVACOR) 40 MG tablet Take 20 mg by mouth at bedtime.      Fluticasone-Salmeterol (ADVAIR DISKUS IN) Inhale into the lungs.     Current Facility-Administered  Medications  Medication Dose Route Frequency Provider Last Rate Last Admin   0.9 %  sodium chloride infusion  500 mL Intravenous Once Gatha Mayer, MD        Allergies as of 05/16/2022 - Review Complete 05/16/2022  Allergen Reaction Noted   Penicillins Hives 09/25/2011   Septra [sulfamethoxazole-trimethoprim]  10/24/2011    Family History  Problem Relation Age of Onset   Cerebral aneurysm Mother    CAD Father 29   Heart attack Brother 86   Colon cancer Neg Hx    Stomach cancer Neg Hx    Colon polyps Neg Hx    Esophageal cancer Neg Hx    Rectal cancer Neg Hx     Social History   Socioeconomic History   Marital status: Married    Spouse name: Not on file   Number of children: Not on file   Years of education: Not on file   Highest education level: Not on file  Occupational History   Occupation: retired    Comment: Development worker, international aid for [employer]  Tobacco Use   Smoking status: Never   Smokeless tobacco: Never  Substance and Sexual Activity   Alcohol use: Yes    Alcohol/week: 2.0 standard drinks of alcohol    Types: 2 Glasses of wine per week  Comment: occasionally   Drug use: No   Sexual activity: Not on file  Other Topics Concern   Not on file  Social History Narrative   Lives with wife.  Gentleman farmer.    Social Determinants of Health   Financial Resource Strain: Not on file  Food Insecurity: Not on file  Transportation Needs: Not on file  Physical Activity: Not on file  Stress: Not on file  Social Connections: Not on file  Intimate Partner Violence: Not on file    Review of Systems:  All other review of systems negative except as mentioned in the HPI.  Physical Exam: Vital signs BP 127/61   Pulse (!) 55   Temp 97.7 F (36.5 C)   Ht '6\' 1"'$  (1.854 m)   Wt 240 lb (108.9 kg)   SpO2 97%   BMI 31.66 kg/m   General:   Alert,  Well-developed, well-nourished, pleasant and cooperative in NAD Lungs:  Clear throughout to  auscultation.   Heart:  Regular rate and rhythm; no murmurs, clicks, rubs,  or gallops. Abdomen:  Soft, nontender and nondistended. Normal bowel sounds.   Neuro/Psych:  Alert and cooperative. Normal mood and affect. A and O x 3   '@Jannah Guardiola'$  Simonne Maffucci, MD, [facility] [phone] (pager) 05/16/2022 10:10 AM@

## 2022-05-16 NOTE — Progress Notes (Signed)
Sedate, gd SR, tolerated procedure well, VSS, report to RN 

## 2022-05-16 NOTE — Progress Notes (Signed)
Called to room to assist during endoscopic procedure.  Patient ID and intended procedure confirmed with present staff. Received instructions for my participation in the procedure from the performing physician.  

## 2022-05-16 NOTE — Progress Notes (Signed)
Pt's states no medical or surgical changes since previsit or office visit. 

## 2022-05-16 NOTE — Patient Instructions (Addendum)
You still have diverticulosis - thickened muscle rings and pouches in the colon wall. Please read the handout about this condition. Hemorrhoids also seen.  I appreciate the opportunity to care for you. Gatha Mayer, MD, [facility]  RECOMMENDATIONS: - Patient has a contact number available for emergencies. The signs and symptoms of potential delayed complications were discussed with the patient. Return to normal activities tomorrow. Written discharge instructions were provided to the patient. - Resume previous diet. - Continue present medications. - Await pathology results. - No recommendation at this time regarding repeat colonoscopy due to age.    YOU HAD AN ENDOSCOPIC PROCEDURE TODAY AT [facility]:   Refer to the procedure report that was given to you for any specific questions about what was found during the examination.  If the procedure report does not answer your questions, please call your gastroenterologist to clarify.  If you requested that your care partner not be given the details of your procedure findings, then the procedure report has been included in a sealed envelope for you to review at your convenience later.  YOU SHOULD EXPECT: Some feelings of bloating in the abdomen. Passage of more gas than usual.  Walking can help get rid of the air that was put into your GI tract during the procedure and reduce the bloating. If you had a lower endoscopy (such as a colonoscopy or flexible sigmoidoscopy) you may notice spotting of blood in your stool or on the toilet paper. If you underwent a bowel prep for your procedure, you may not have a normal bowel movement for a few days.  Please Note:  You might notice some irritation and congestion in your nose or some drainage.  This is from the oxygen used during your procedure.  There is no need for concern and it should clear up in a day or so.  SYMPTOMS TO REPORT IMMEDIATELY:  Following lower endoscopy (colonoscopy or  flexible sigmoidoscopy):  Excessive amounts of blood in the stool  Significant tenderness or worsening of abdominal pains  Swelling of the abdomen that is new, acute  Fever of 100F or higher   For urgent or emergent issues, a gastroenterologist can be reached at any hour by calling [phone]. Do not use MyChart messaging for urgent concerns.    DIET:  We do recommend a small meal at first, but then you may proceed to your regular diet.  Drink plenty of fluids but you should avoid alcoholic beverages for 24 hours.  MEDICATIONS: Continue present medications.  Please see handouts given to you by your recovery nurse: polyps, diverticulosis, hemorrhoids.  Thank you for allowing [place] to provide for your healthcare needs today.  ACTIVITY:  You should plan to take it easy for the rest of today and you should NOT DRIVE or use heavy machinery until tomorrow (because of the sedation medicines used during the test).    FOLLOW UP: Our staff will call the number listed on your records the next business day following your procedure.  We will call around 7:15- 8:00 am to check on you and address any questions or concerns that you may have regarding the information given to you following your procedure. If we do not reach you, we will leave a message.     If any biopsies were taken you will be contacted by phone or by letter within the next 1-3 weeks.  Please call us at [phone] if you have not heard about the biopsies in 3 weeks.  SIGNATURES/CONFIDENTIALITY: You and/or your care partner have signed paperwork which will be entered into your electronic medical record.  These signatures attest to the fact that that the information above on your After Visit Summary has been reviewed and is understood.  Full responsibility of the confidentiality of this discharge information lies with you and/or your care-partner.I found and removed two tiny polyps. They will be analyzed. I am thinking you will  not need to come back for a routine colonoscopy.

## 2022-05-17 ENCOUNTER — Telehealth: Payer: Self-pay | Admitting: *Deleted

## 2022-05-17 NOTE — Telephone Encounter (Signed)
Attempted to call patient for their post-procedure follow-up call. No answer. Left voicemail.   

## 2022-05-30 ENCOUNTER — Encounter: Payer: Self-pay | Admitting: Internal Medicine

## 2022-05-30 DIAGNOSIS — Z86010 Personal history of colonic polyps: Secondary | ICD-10-CM

## 2022-06-08 ENCOUNTER — Other Ambulatory Visit: Payer: Self-pay | Admitting: Thoracic Surgery (Cardiothoracic Vascular Surgery)

## 2022-06-08 DIAGNOSIS — I7121 Aneurysm of the ascending aorta, without rupture: Secondary | ICD-10-CM

## 2022-07-12 ENCOUNTER — Ambulatory Visit
Admission: RE | Admit: 2022-07-12 | Discharge: 2022-07-12 | Disposition: A | Discharge status: Home / Self Care | Payer: Medicare Other | Source: Ambulatory Visit | Attending: Thoracic Surgery (Cardiothoracic Vascular Surgery) | Admitting: Thoracic Surgery (Cardiothoracic Vascular Surgery)

## 2022-07-12 DIAGNOSIS — I7121 Aneurysm of the ascending aorta, without rupture: Secondary | ICD-10-CM

## 2022-07-12 MED ORDER — IOPAMIDOL (ISOVUE-370) INJECTION 76%
75.0000 mL | Freq: Once | INTRAVENOUS | Status: AC | PRN
  Administered 2022-07-12: 75 mL via INTRAVENOUS

## 2022-07-18 ENCOUNTER — Ambulatory Visit (INDEPENDENT_AMBULATORY_CARE_PROVIDER_SITE_OTHER): Payer: Medicare Other | Admitting: Thoracic Surgery (Cardiothoracic Vascular Surgery)

## 2022-07-18 ENCOUNTER — Encounter: Payer: Self-pay | Admitting: Thoracic Surgery (Cardiothoracic Vascular Surgery)

## 2022-07-18 VITALS — BP 148/79 | HR 56 | Resp 18 | Ht 73.0 in | Wt 225.0 lb

## 2022-07-18 DIAGNOSIS — I7121 Aneurysm of the ascending aorta, without rupture: Secondary | ICD-10-CM

## 2022-07-18 NOTE — Progress Notes (Signed)
[address] [address]       Reed,Troy Reed             [phone]     HPI: [name] returns for follow-up of an ascending aneurysm  Troy Reed is a 72 year old man with a history of an ascending aneurysm, thoracic aortic atherosclerosis, hypertension, hyperlipidemia, degenerative disc disease, rib fractures, adenomatous polyps in the colon, and diverticulosis.  First noted to have an ascending aneurysm in 2005.  Measured 4.3 cm at that time.  Has been followed since then.  Aneurysm has remained essentially stable although the measurements have varied from time to time with different readers.  Past Medical History:  Diagnosis Date   Ascending aortic aneurysm (HCC)    4.4 cm   DDD (degenerative disc disease), cervical    Diverticulosis    History of colonic polyps    Hyperlipidemia    Hypertension    Sleep apnea     Current Outpatient Medications  Medication Sig Dispense Refill   albuterol (VENTOLIN HFA) 108 (90 Base) MCG/ACT inhaler Inhale 2 puffs into the lungs every 6 (six) hours as needed.      Fluticasone-Salmeterol (ADVAIR DISKUS IN) Inhale into the lungs.     hydrochlorothiazide (HYDRODIURIL) 25 MG tablet Take 25 mg by mouth every morning.     losartan (COZAAR) 100 MG tablet Take 100 mg by mouth.     lovastatin (MEVACOR) 40 MG tablet Take 20 mg by mouth at bedtime.      Current Facility-Administered Medications  Medication Dose Route Frequency Provider Last Rate Last Admin   0.9 %  sodium chloride infusion  500 mL Intravenous Once Troy Mayer, MD        Physical Exam BP (!) 148/79 (BP Location: Left Arm, Patient Position: Sitting)   Pulse (!) 56   Resp 18   Ht '6\' 1"'$  (1.854 m)   Wt 225 lb (102.1 kg)   SpO2 96% Comment: RA  BMI 29.70 kg/m  72 year old man in no acute distress Alert and oriented x 3 with no focal deficits Well-developed well-nourished No carotid bruits Lungs clear bilaterally Cardiac regular rate and rhythm, normal S1 and  S2, no murmur No peripheral edema  Diagnostic Tests: CT ANGIOGRAPHY CHEST WITH CONTRAST   TECHNIQUE: Multidetector CT imaging of the chest was performed using the standard protocol during bolus administration of intravenous contrast. Multiplanar CT image reconstructions and MIPs were obtained to evaluate the vascular anatomy.   RADIATION DOSE REDUCTION: This exam was performed according to the departmental dose-optimization program which includes automated exposure control, adjustment of the mA and/or kV according to patient size and/or use of iterative reconstruction technique.   CONTRAST:  35m ISOVUE-370 IOPAMIDOL (ISOVUE-370) INJECTION 76%   COMPARISON:  CTA of the chest July 14, 2021.   FINDINGS: Cardiovascular: Stable aneurysmal dilation of the ascending aorta measuring up to 4.3 cm. Calcific atherosclerosis. Normal heart size. No pericardial effusion. Coronary artery atherosclerosis.   Mediastinum/Nodes: No enlarged mediastinal, hilar, or axillary lymph nodes. Thyroid gland, trachea, and esophagus demonstrate no significant findings.   Lungs/Pleura: Lungs are clear. No pleural effusion or pneumothorax.   Upper Abdomen: No acute abnormality. Similar probable up attic steatosis. Unchanged enhancing small splenic lesion, probably be a benign hemangioma.   Musculoskeletal: Multilevel degenerative change. No acute findings on limited assessment.   Review of the MIP images confirms the above findings.   IMPRESSION: Stable aneurysmal dilation of the ascending aorta measuring up to 4.3  cm. Recommend follow-up every 12 months and vascular consultation. This recommendation follows ACR consensus guidelines: White Paper of the ACR Incidental Findings Committee II on Vascular Findings. J Am Coll Radiol 2013; 10:789-794.   Aortic aneurysm NOS (ICD10-I71.9) and aortic atherosclerosis (ICD10-I70.0).     Electronically Signed   By: Margaretha Sheffield M.D.   On: 07/12/2022  12:54   I personally reviewed the CT images.  My measurements of the aneurysm 4.4 cm on the coronal views and 4.3 cm on the axial views.  Opacity base of right lung adjacent to the chest wall decreased in size compared to films in August 2021 consistent with scarring.  Impression: Troy Reed is a 72 year old man with a history of an ascending aneurysm, thoracic aortic atherosclerosis, hypertension, hyperlipidemia, degenerative disc disease, rib fractures, adenomatous polyps in the colon, and diverticulosis.  Ascending aneurysm-first noted almost 20 years ago.  Has been stable.  No change from last year's scan.  Will plan to see him back with another CT in a year.  He is aware of the importance of blood pressure control.  Hypertension-blood pressure mildly elevated today.  He does check himself at home and typically runs around [id number] systolic.  Advised him to continue to monitor at home and if he sees values consistently above 140 he needs additional medication adjustments.  Thoracic aortic atherosclerosis-on lovastatin  Opacity right lower lobe-this decreased in size over time.  Was adjacent to some rib fractures.  Non-smoker.  Likely scarring.  Can continue to monitor as we follow his aneurysm.  Plan: Return in 1 year with CT angio of chest  Melrose Nakayama, MD [facility] [phone]

## 2023-06-11 ENCOUNTER — Other Ambulatory Visit: Payer: Self-pay | Admitting: Thoracic Surgery (Cardiothoracic Vascular Surgery)

## 2023-06-11 DIAGNOSIS — I7121 Aneurysm of the ascending aorta, without rupture: Secondary | ICD-10-CM

## 2023-07-16 ENCOUNTER — Ambulatory Visit
Admission: RE | Admit: 2023-07-16 | Discharge: 2023-07-16 | Disposition: A | Discharge status: Home / Self Care | Payer: Medicare Other | Source: Ambulatory Visit | Attending: Thoracic Surgery (Cardiothoracic Vascular Surgery) | Admitting: Thoracic Surgery (Cardiothoracic Vascular Surgery)

## 2023-07-16 DIAGNOSIS — I7121 Aneurysm of the ascending aorta, without rupture: Secondary | ICD-10-CM

## 2023-07-16 MED ORDER — IOPAMIDOL (ISOVUE-370) INJECTION 76%
75.0000 mL | Freq: Once | INTRAVENOUS | Status: AC | PRN
  Administered 2023-07-16: 75 mL via INTRAVENOUS

## 2023-07-24 ENCOUNTER — Encounter: Payer: Self-pay | Admitting: Thoracic Surgery (Cardiothoracic Vascular Surgery)

## 2023-07-24 ENCOUNTER — Ambulatory Visit (INDEPENDENT_AMBULATORY_CARE_PROVIDER_SITE_OTHER): Payer: Medicare Other | Admitting: Thoracic Surgery (Cardiothoracic Vascular Surgery)

## 2023-07-24 VITALS — BP 124/75 | HR 63 | Resp 20 | Ht 73.0 in | Wt 255.0 lb

## 2023-07-24 DIAGNOSIS — I7121 Aneurysm of the ascending aorta, without rupture: Secondary | ICD-10-CM | POA: Diagnosis not present

## 2023-07-24 NOTE — Progress Notes (Signed)
 [address]       T[place] Reed [address]             [phone]     HPI: Troy Reed returns for follow-up regarding his ascending aneurysm  Troy Reed is a 73 year old male with a history of an ascending aortic aneurysm, thoracic aortic atherosclerosis, hypertension, hyperlipidemia, degenerative disc disease, rib fractures, adenomatous polyps of the colon, and diverticulosis.  Noted to have an ascending aneurysm 20 years ago in 2005.  Was 4.3 cm.  It has remained stable over time.  I saw him in the office a year ago.  He was doing well at that time.  He continues to feel well.  No chest pain, pressure, tightness, or shortness of breath.  No significant medical issues.  Monitors his blood pressure at home and says it typically runs between 120 and 135 systolic.  He is getting a new cuff.  Past Medical History:  Diagnosis Date   Ascending aortic aneurysm (HCC)    4.4 cm   DDD (degenerative disc disease), cervical    Diverticulosis    History of colonic polyps    Hyperlipidemia    Hypertension    Sleep apnea     Current Outpatient Medications  Medication Sig Dispense Refill   albuterol (VENTOLIN HFA) 108 (90 Base) MCG/ACT inhaler Inhale 2 puffs into the lungs every 6 (six) hours as needed.      Fluticasone-Salmeterol (ADVAIR DISKUS IN) Inhale into the lungs.     hydrochlorothiazide (HYDRODIURIL) 25 MG tablet Take 25 mg by mouth every morning.     losartan (COZAAR) 100 MG tablet Take 100 mg by mouth.     lovastatin (MEVACOR) 40 MG tablet Take 20 mg by mouth at bedtime.      Current Facility-Administered Medications  Medication Dose Route Frequency Provider Last Rate Last Admin   0.9 %  sodium chloride infusion  500 mL Intravenous Once Iva Boop, MD        Physical Exam BP 124/75 (BP Location: Left Arm, Patient Position: Sitting, Cuff Size: Normal)   Pulse 63   Resp 20   Ht 6\' 1"  (1.854 m)   Wt 255 lb (115.7 kg)   SpO2 95% Comment: RA  BMI 33.64  kg/m  Well-appearing 73 year old man in no acute distress Alert and oriented x 3 with no focal deficits Lungs clear bilaterally Cardiac regular rate and rhythm with a normal S1 and S2 No carotid bruits No peripheral edema  Diagnostic Tests: CT ANGIOGRAPHY CHEST WITH CONTRAST   TECHNIQUE: Multidetector CT imaging of the chest was performed using the standard protocol during bolus administration of intravenous contrast. Multiplanar CT image reconstructions and MIPs were obtained to evaluate the vascular anatomy.   RADIATION DOSE REDUCTION: This exam was performed according to the departmental dose-optimization program which includes automated exposure control, adjustment of the mA and/or kV according to patient size and/or use of iterative reconstruction technique.   CONTRAST:  75mL ISOVUE-370 IOPAMIDOL (ISOVUE-370) INJECTION 76%   COMPARISON:  Chest CT dated 07/12/2022.   FINDINGS: Cardiovascular: There is no cardiomegaly or pericardial effusion. Three-vessel coronary vascular calcification. Mild atherosclerotic calcification of the thoracic aorta. Top-normal ascending aorta measures up to 4 cm in diameter. No aortic dissection. The origins of the great vessels of the aortic arch appear patent.   Mediastinum/Nodes: No hilar or mediastinal adenopathy. The esophagus is grossly unremarkable. No mediastinal fluid collection.   Lungs/Pleura: There is a 3 mm nodule in  the left lower lobe (107/6) stable since the prior CT. No focal consolidation, pleural effusion, or pneumothorax. The central airways are patent.   Upper Abdomen: No acute abnormality.   Musculoskeletal: Degenerative changes of the spine. Old healed bilateral rib fractures. No acute osseous pathology.   Review of the MIP images confirms the above findings.   IMPRESSION: 1. No acute intrathoracic pathology. 2. Top-normal ascending aorta measures up to 4 cm in diameter. Recommend annual imaging followup by CTA  or MRA. This recommendation follows 2010 ACCF/AHA/AATS/ACR/ASA/SCA/SCAI/SIR/STS/SVM Guidelines for the Diagnosis and Management of Patients with Thoracic Aortic Disease. Circulation. 2010; 121: Z610-R604. Aortic aneurysm NOS (ICD10-I71.9) 3. A 3 mm nodule in the left lower lobe, stable since the prior CT. No follow-up needed if patient is low-risk.This recommendation follows the consensus statement: Guidelines for Management of Incidental Pulmonary Nodules Detected on CT Images: From the Fleischner Society 2017; Radiology 2017; 284:228-243.     Electronically Signed   By: Elgie Collard M.D.   On: 07/16/2023 16:34 I personally reviewed the CT images.  No change in the 4.3 cm ascending aneurysm.  Stable 3 mm left lower lobe lung nodule.  Old rib fractures.  Impression: Troy Reed is a 73 year old male with a history of an ascending aortic aneurysm, thoracic aortic atherosclerosis, hypertension, hyperlipidemia, degenerative disc disease, rib fractures, adenomatous polyps of the colon, and diverticulosis.  Ascending aneurysm-stable at 4.3 cm.  Needs continued annual follow-up.  Hypertension-blood pressure well-controlled on current regimen.  Hyperlipidemia-on statin.  Plan: Return in 1 year with CT angio of chest  Loreli Slot, MD [facility] [phone]

## 2023-12-04 NOTE — Procedures (Signed)
Result scanned to media
